# Patient Record
Sex: Female | Born: 1975 | Race: White | Hispanic: No | Smoking: Never smoker
Health system: Southern US, Community
[De-identification: ages and names within clinical notes are randomized; demographics above are authoritative.]

## PROBLEM LIST (undated history)

## (undated) DIAGNOSIS — E039 Hypothyroidism, unspecified: Secondary | ICD-10-CM

## (undated) DIAGNOSIS — Z8639 Personal history of other endocrine, nutritional and metabolic disease: Secondary | ICD-10-CM

## (undated) DIAGNOSIS — E7800 Pure hypercholesterolemia, unspecified: Secondary | ICD-10-CM

## (undated) DIAGNOSIS — E229 Hyperfunction of pituitary gland, unspecified: Secondary | ICD-10-CM

## (undated) DIAGNOSIS — F5101 Primary insomnia: Secondary | ICD-10-CM

## (undated) DIAGNOSIS — K921 Melena: Secondary | ICD-10-CM

## (undated) DIAGNOSIS — Z8249 Family history of ischemic heart disease and other diseases of the circulatory system: Secondary | ICD-10-CM

## (undated) DIAGNOSIS — Z820 Family history of epilepsy and other diseases of the nervous system: Secondary | ICD-10-CM

## (undated) DIAGNOSIS — G8929 Other chronic pain: Secondary | ICD-10-CM

## (undated) DIAGNOSIS — M549 Dorsalgia, unspecified: Secondary | ICD-10-CM

## (undated) HISTORY — DX: Hypothyroidism, unspecified: E03.9

## (undated) HISTORY — DX: Hyperfunction of pituitary gland, unspecified: E22.9

## (undated) HISTORY — DX: Pure hypercholesterolemia, unspecified: E78.00

## (undated) HISTORY — DX: Melena: K92.1

## (undated) HISTORY — DX: Personal history of other endocrine, nutritional and metabolic disease: Z86.39

## (undated) HISTORY — DX: Dorsalgia, unspecified: M54.9

## (undated) HISTORY — DX: Primary insomnia: F51.01

## (undated) HISTORY — DX: Family history of epilepsy and other diseases of the nervous system: Z82.0

## (undated) HISTORY — DX: Other chronic pain: G89.29

## (undated) HISTORY — DX: Family history of ischemic heart disease and other diseases of the circulatory system: Z82.49

---
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
## 2000-08-18 ENCOUNTER — Encounter: Admission: RE | Admit: 2000-08-18 | Discharge: 2000-08-18 | Payer: Self-pay | Admitting: Family Medicine

## 2000-08-18 ENCOUNTER — Encounter: Payer: Self-pay | Admitting: Family Medicine

## 2003-08-01 ENCOUNTER — Other Ambulatory Visit: Admission: RE | Admit: 2003-08-01 | Discharge: 2003-08-01 | Payer: Self-pay | Admitting: Obstetrics and Gynecology

## 2007-10-16 ENCOUNTER — Encounter: Admission: RE | Admit: 2007-10-16 | Discharge: 2007-10-16 | Payer: Self-pay | Admitting: Obstetrics and Gynecology

## 2010-04-09 ENCOUNTER — Encounter: Admission: RE | Admit: 2010-04-09 | Discharge: 2010-04-09 | Payer: Self-pay | Admitting: Obstetrics and Gynecology

## 2010-12-19 ENCOUNTER — Encounter: Payer: Self-pay | Admitting: Obstetrics and Gynecology

## 2012-06-13 ENCOUNTER — Other Ambulatory Visit: Payer: Self-pay | Admitting: Obstetrics and Gynecology

## 2012-06-13 DIAGNOSIS — Z1231 Encounter for screening mammogram for malignant neoplasm of breast: Secondary | ICD-10-CM

## 2012-07-06 ENCOUNTER — Ambulatory Visit: Payer: Self-pay

## 2016-05-12 ENCOUNTER — Other Ambulatory Visit (HOSPITAL_COMMUNITY): Payer: Self-pay | Admitting: Orthopedic Surgery

## 2016-05-12 ENCOUNTER — Ambulatory Visit (HOSPITAL_COMMUNITY)
Admission: RE | Admit: 2016-05-12 | Discharge: 2016-05-12 | Disposition: A | Discharge status: Home / Self Care | Payer: Managed Care, Other (non HMO) | Source: Ambulatory Visit | Attending: Internal Medicine | Admitting: Internal Medicine

## 2016-05-12 DIAGNOSIS — M79604 Pain in right leg: Secondary | ICD-10-CM | POA: Insufficient documentation

## 2016-11-28 HISTORY — PX: WRIST SURGERY: SHX841

## 2017-04-12 ENCOUNTER — Other Ambulatory Visit: Payer: Self-pay | Admitting: Nurse Practitioner

## 2017-04-12 DIAGNOSIS — N6320 Unspecified lump in the left breast, unspecified quadrant: Secondary | ICD-10-CM

## 2017-04-14 ENCOUNTER — Ambulatory Visit
Admission: RE | Admit: 2017-04-14 | Discharge: 2017-04-14 | Disposition: A | Discharge status: Home / Self Care | Payer: Managed Care, Other (non HMO) | Source: Ambulatory Visit | Attending: Nurse Practitioner | Admitting: Nurse Practitioner

## 2017-04-14 DIAGNOSIS — N6320 Unspecified lump in the left breast, unspecified quadrant: Secondary | ICD-10-CM

## 2017-08-14 ENCOUNTER — Other Ambulatory Visit: Payer: Self-pay | Admitting: Obstetrics and Gynecology

## 2017-08-14 DIAGNOSIS — R928 Other abnormal and inconclusive findings on diagnostic imaging of breast: Secondary | ICD-10-CM

## 2017-08-18 ENCOUNTER — Ambulatory Visit
Admission: RE | Admit: 2017-08-18 | Discharge: 2017-08-18 | Disposition: A | Discharge status: Home / Self Care | Payer: Managed Care, Other (non HMO) | Source: Ambulatory Visit | Attending: Obstetrics and Gynecology | Admitting: Obstetrics and Gynecology

## 2017-08-18 DIAGNOSIS — R928 Other abnormal and inconclusive findings on diagnostic imaging of breast: Secondary | ICD-10-CM

## 2017-09-28 LAB — TSH: TSH: 1.06 (ref 0.41–5.90)

## 2017-10-05 LAB — HEPATIC FUNCTION PANEL
ALT: 19 (ref 7–35)
AST: 16 (ref 13–35)
Alkaline Phosphatase: 63 (ref 25–125)

## 2017-10-05 LAB — LIPID PANEL
Cholesterol: 240 — AB (ref 0–200)
HDL: 61 (ref 35–70)
LDL Cholesterol: 154
Triglycerides: 127 (ref 40–160)

## 2017-10-05 LAB — BASIC METABOLIC PANEL
Glucose: 84
Potassium: 4.3 (ref 3.4–5.3)
Sodium: 138 (ref 137–147)

## 2017-10-05 LAB — HEMOGLOBIN A1C: Hemoglobin A1C: 5.2

## 2018-01-23 DIAGNOSIS — M25561 Pain in right knee: Secondary | ICD-10-CM | POA: Insufficient documentation

## 2018-02-06 ENCOUNTER — Other Ambulatory Visit: Payer: Self-pay | Admitting: Radiology

## 2018-02-06 DIAGNOSIS — N6310 Unspecified lump in the right breast, unspecified quadrant: Secondary | ICD-10-CM

## 2018-02-07 ENCOUNTER — Ambulatory Visit
Admission: RE | Admit: 2018-02-07 | Discharge: 2018-02-07 | Disposition: A | Discharge status: Home / Self Care | Payer: Managed Care, Other (non HMO) | Source: Ambulatory Visit | Attending: Radiology | Admitting: Radiology

## 2018-02-07 DIAGNOSIS — N6310 Unspecified lump in the right breast, unspecified quadrant: Secondary | ICD-10-CM

## 2018-07-31 DIAGNOSIS — M549 Dorsalgia, unspecified: Secondary | ICD-10-CM

## 2018-07-31 DIAGNOSIS — G8929 Other chronic pain: Secondary | ICD-10-CM | POA: Insufficient documentation

## 2018-07-31 HISTORY — DX: Other chronic pain: G89.29

## 2018-08-17 LAB — LIPID PANEL
Cholesterol: 258 — AB (ref 0–200)
HDL: 66 (ref 35–70)
LDL Cholesterol: 168
Triglycerides: 117 (ref 40–160)

## 2018-08-17 LAB — TSH: TSH: 1.17 (ref 0.41–5.90)

## 2019-02-05 ENCOUNTER — Encounter: Payer: Self-pay | Admitting: Physician Assistant

## 2019-02-05 ENCOUNTER — Other Ambulatory Visit: Payer: Self-pay | Admitting: Physician Assistant

## 2019-02-15 ENCOUNTER — Ambulatory Visit: Payer: Managed Care, Other (non HMO) | Admitting: Family Medicine

## 2019-02-26 ENCOUNTER — Encounter: Payer: Self-pay | Admitting: Family Medicine

## 2019-02-26 ENCOUNTER — Other Ambulatory Visit: Payer: Self-pay

## 2019-02-26 ENCOUNTER — Ambulatory Visit (INDEPENDENT_AMBULATORY_CARE_PROVIDER_SITE_OTHER): Payer: BLUE CROSS/BLUE SHIELD | Admitting: Family Medicine

## 2019-02-26 VITALS — BP 120/82 | HR 68 | Temp 98.7°F | Ht 61.75 in | Wt 149.6 lb

## 2019-02-26 DIAGNOSIS — E039 Hypothyroidism, unspecified: Secondary | ICD-10-CM | POA: Diagnosis not present

## 2019-02-26 DIAGNOSIS — R7989 Other specified abnormal findings of blood chemistry: Secondary | ICD-10-CM

## 2019-02-26 DIAGNOSIS — Z820 Family history of epilepsy and other diseases of the nervous system: Secondary | ICD-10-CM

## 2019-02-26 DIAGNOSIS — F5101 Primary insomnia: Secondary | ICD-10-CM | POA: Diagnosis not present

## 2019-02-26 DIAGNOSIS — E229 Hyperfunction of pituitary gland, unspecified: Secondary | ICD-10-CM

## 2019-02-26 DIAGNOSIS — E7800 Pure hypercholesterolemia, unspecified: Secondary | ICD-10-CM

## 2019-02-26 DIAGNOSIS — Z1331 Encounter for screening for depression: Secondary | ICD-10-CM

## 2019-02-26 DIAGNOSIS — Z8249 Family history of ischemic heart disease and other diseases of the circulatory system: Secondary | ICD-10-CM

## 2019-02-26 DIAGNOSIS — Z86018 Personal history of other benign neoplasm: Secondary | ICD-10-CM

## 2019-02-26 DIAGNOSIS — Z8639 Personal history of other endocrine, nutritional and metabolic disease: Secondary | ICD-10-CM

## 2019-02-26 MED ORDER — TRAZODONE HCL 50 MG PO TABS: ORAL_TABLET | ORAL | 1 refills | Status: DC

## 2019-02-26 NOTE — Progress Notes (Signed)
Alexis Walker is a 43 y.o. female is here to [employer].   Patient Care Team: Briscoe Deutscher, DO as PCP - General (Family Medicine)   History of Present Illness:   HPI:   Current symptoms: none. Patient denies change in energy level, diarrhea, heat / cold intolerance, nervousness, palpitations and weight changes. Symptoms have been well-controlled.  Lab Results  Component Value Date   TSH 1.17 08/17/2018   TSH 1.06 10/05/2017   No results found for: FREET4   Trying to exercise on a regular basis? No. Compliant with diet? Yes.  Lab Results  Component Value Date   CHOL 258 (A) 08/17/2018   HDL 66 08/17/2018   LDLCALC 168 08/17/2018   TRIG 117 08/17/2018   Lab Results  Component Value Date   ALT 19 10/05/2017   AST 16 10/05/2017   ALKPHOS 63 10/05/2017     The 10-year ASCVD risk score Mikey Bussing DC Jr., et al., 2013) is: 0.7%*   Values used to calculate the score:     Age: 43 years     Sex: Female     Is Non-Hispanic African American: No     Diabetic: No     Tobacco smoker: No     Systolic Blood Pressure: 191 mmHg     Is BP treated: No     HDL Cholesterol: 66 mg/dL*     Total Cholesterol: 258 mg/dL*     * - Cholesterol units were assumed for this score calculation  Health Maintenance Due  Topic Date Due  . HIV Screening  07/03/1990   Depression screen PHQ 2/9 02/26/2019  Decreased Interest 0  Down, Depressed, Hopeless 0  PHQ - 2 Score 0  Altered sleeping 3  Tired, decreased energy 3  Change in appetite 0  Feeling bad or failure about yourself  0  Trouble concentrating 0  Moving slowly or fidgety/restless 0  Suicidal thoughts 0  PHQ-9 Score 6  Difficult doing work/chores Not difficult at all   PMHx, SurgHx, SocialHx, Medications, and Allergies were reviewed in the Visit Navigator and updated as appropriate.   Past Medical History:  Diagnosis Date  . Acquired hypothyroidism 02/27/2019  . Chronic back pain 07/31/2018  . Elevated prolactin level ([place]) 02/27/2019   . Family history of heart disease 02/27/2019  . Family hx of ALS (amyotrophic lateral sclerosis), father 02/27/2019  . History of pituitary adenoma 02/27/2019  . Primary insomnia 02/27/2019  . Pure hypercholesterolemia 02/27/2019     Past Surgical History:  Procedure Laterality Date  . APPENDECTOMY  1990  . WRIST SURGERY  2018     Family History  Problem Relation Age of Onset  . Breast cancer Paternal Aunt    Social History   Tobacco Use  . Smoking status: Never Smoker  . Smokeless tobacco: Never Used  Substance Use Topics  . Alcohol use: Yes  . Drug use: Not Currently   Current Medications and Allergies   .  Norethin Ace-Eth Estrad-FE (TAYTULLA) 1-20 MG-MCG(24) CAPS, Take by mouth., Disp: , Rfl:  .  SYNTHROID 150 MCG tablet, Take 150 mcg by mouth daily., Disp: , Rfl:  .  zolpidem (AMBIEN) 5 MG tablet, TAKE 1 TABLET BY MOUTH ONCE DAILY AT BEDTIME AS NEEDED FOR SLEEP, Disp: , Rfl:   No Known Allergies   Review of Systems   Pertinent items are noted in the HPI. Otherwise, a complete ROS is negative.  Vitals   Vitals:   02/26/19 1440  BP: 120/82  Pulse: 68  Temp: 98.7 F (37.1 C)  TempSrc: Oral  SpO2: 99%  Weight: 149 lb 9.6 oz (67.9 kg)  Height: 5' 1.75" (1.568 m)     Body mass index is 27.58 kg/m.  Physical Exam   Physical Exam Vitals signs and nursing note reviewed.  Constitutional:      General: She is not in acute distress.    Appearance: She is well-developed.  HENT:     Head: Normocephalic and atraumatic.     Right Ear: External ear normal.     Left Ear: External ear normal.     Nose: Nose normal.  Eyes:     Conjunctiva/sclera: Conjunctivae normal.     Pupils: Pupils are equal, round, and reactive to light.  Neck:     Musculoskeletal: Normal range of motion and neck supple.     Thyroid: No thyromegaly.  Cardiovascular:     Rate and Rhythm: Normal rate and regular rhythm.     Heart sounds: Normal heart sounds.  Pulmonary:     Effort: Pulmonary  effort is normal.     Breath sounds: Normal breath sounds.  Abdominal:     General: Bowel sounds are normal.     Palpations: Abdomen is soft.  Musculoskeletal: Normal range of motion.  Lymphadenopathy:     Cervical: No cervical adenopathy.  Skin:    General: Skin is warm and dry.     Capillary Refill: Capillary refill takes less than 2 seconds.  Neurological:     Mental Status: She is alert and oriented to person, place, and time.  Psychiatric:        Behavior: Behavior normal.     Results for orders placed or performed in visit on 02/05/19  Lipid panel  Result Value Ref Range   Triglycerides 117 40 - 160   Cholesterol 258 (A) 0 - 200   HDL 66 35 - 70   LDL Cholesterol 168   TSH  Result Value Ref Range   TSH 1.17 0.41 - 5.90    Assessment and Plan   Alexis Walker was seen today for establish care, insomnia and hyperlipidemia.  Diagnoses and all orders for this visit:  Pure hypercholesterolemia -     CT CARDIAC SCORING; Future  Acquired hypothyroidism  Primary insomnia -     traZODone (DESYREL) 50 MG tablet; 1-2 tablets at night  Elevated prolactin level (HCC)  History of pituitary adenoma  Family hx of ALS (amyotrophic lateral sclerosis), father  Family history of heart disease -     CT CARDIAC SCORING; Future    . Orders and follow up as documented in [place], reviewed diet, exercise and weight control, cardiovascular risk and specific lipid/LDL goals reviewed, reviewed medications and side effects in detail.  . Reviewed expectations re: course of current medical issues. . Outlined signs and symptoms indicating need for more acute intervention. . Patient verbalized understanding and all questions were answered. . Patient received an After Visit Summary.  Briscoe Deutscher, DO [place], [facility] 03/03/2019

## 2019-02-27 ENCOUNTER — Encounter: Payer: Self-pay | Admitting: Family Medicine

## 2019-02-27 DIAGNOSIS — Z820 Family history of epilepsy and other diseases of the nervous system: Secondary | ICD-10-CM

## 2019-02-27 DIAGNOSIS — R7989 Other specified abnormal findings of blood chemistry: Secondary | ICD-10-CM

## 2019-02-27 DIAGNOSIS — E039 Hypothyroidism, unspecified: Secondary | ICD-10-CM

## 2019-02-27 DIAGNOSIS — E229 Hyperfunction of pituitary gland, unspecified: Secondary | ICD-10-CM

## 2019-02-27 DIAGNOSIS — Z8639 Personal history of other endocrine, nutritional and metabolic disease: Secondary | ICD-10-CM

## 2019-02-27 DIAGNOSIS — F5101 Primary insomnia: Secondary | ICD-10-CM

## 2019-02-27 DIAGNOSIS — E7800 Pure hypercholesterolemia, unspecified: Secondary | ICD-10-CM | POA: Insufficient documentation

## 2019-02-27 DIAGNOSIS — Z8249 Family history of ischemic heart disease and other diseases of the circulatory system: Secondary | ICD-10-CM

## 2019-02-27 DIAGNOSIS — Z86018 Personal history of other benign neoplasm: Secondary | ICD-10-CM | POA: Insufficient documentation

## 2019-02-27 HISTORY — DX: Hypothyroidism, unspecified: E03.9

## 2019-02-27 HISTORY — DX: Other specified abnormal findings of blood chemistry: R79.89

## 2019-02-27 HISTORY — DX: Primary insomnia: F51.01

## 2019-02-27 HISTORY — DX: Personal history of other benign neoplasm: Z86.018

## 2019-02-27 HISTORY — DX: Family history of epilepsy and other diseases of the nervous system: Z82.0

## 2019-02-27 HISTORY — DX: Pure hypercholesterolemia, unspecified: E78.00

## 2019-02-27 HISTORY — DX: Family history of ischemic heart disease and other diseases of the circulatory system: Z82.49

## 2019-03-11 DIAGNOSIS — J029 Acute pharyngitis, unspecified: Secondary | ICD-10-CM | POA: Diagnosis not present

## 2019-04-03 ENCOUNTER — Telehealth: Payer: Self-pay | Admitting: Family Medicine

## 2019-04-03 NOTE — Telephone Encounter (Signed)
Let's do a virtual appointment on Friday.

## 2019-04-03 NOTE — Telephone Encounter (Signed)
Copied from [place] [id number]. Topic: General - Other >> Apr 03, 2019  8:08 AM Leward Quan A wrote: Reason for CRM: Patient called to say that the traZODone (DESYREL) 50 MG tablet is not working and she have not slept and due to this she is frustrated. She say that she is not feeling well within her body due to the lack of sleep asking for a call back please. Ph# [phone]

## 2019-04-03 NOTE — Telephone Encounter (Signed)
Has tried Trazodone with inadequate effect. She has tried Ambien in the past but does not want to be on something that is habit forming.   Forwarding to Dr. Juleen China to advise.

## 2019-04-05 ENCOUNTER — Ambulatory Visit (INDEPENDENT_AMBULATORY_CARE_PROVIDER_SITE_OTHER): Payer: BLUE CROSS/BLUE SHIELD | Admitting: Family Medicine

## 2019-04-05 ENCOUNTER — Other Ambulatory Visit: Payer: Self-pay

## 2019-04-05 ENCOUNTER — Encounter: Payer: Self-pay | Admitting: Family Medicine

## 2019-04-05 ENCOUNTER — Other Ambulatory Visit: Payer: Self-pay | Admitting: Family Medicine

## 2019-04-05 VITALS — Ht 61.75 in | Wt 149.0 lb

## 2019-04-05 DIAGNOSIS — F5101 Primary insomnia: Secondary | ICD-10-CM

## 2019-04-05 MED ORDER — SUVOREXANT 10 MG PO TABS: 10.0000 mg | ORAL_TABLET | Freq: Every day | ORAL | 0 refills | Status: DC

## 2019-04-05 NOTE — Telephone Encounter (Signed)
Copied from [place] [id number]. Topic: Quick Communication - Rx Refill/Question >> Apr 05, 2019  3:37 PM [place], [place] D wrote: Medication:Suvorexant (BELSOMRA) 10 MG TABS / Rx cost is around $300. Requesting an alternative that would be cheaper. Please advise.  Has the patient contacted their pharmacy? Yes.   (Agent: If no, request that the patient contact the pharmacy for the refill.) (Agent: If yes, when and what did the pharmacy advise?)  Preferred Pharmacy (with phone number or street name): [facility], [phone] (Phone) [phone] (Fax)    Agent: Please be advised that RX refills may take up to 3 business days. We ask that you follow-up with your pharmacy.

## 2019-04-05 NOTE — Progress Notes (Signed)
Virtual Visit via Video   Due to the COVID-19 pandemic, this visit was completed with telemedicine (audio/video) technology to reduce patient and provider exposure as well as to preserve personal protective equipment.   I connected with Alexis Walker by a video enabled telemedicine application and verified that I am speaking with the correct person using two identifiers. Location patient: Home Location provider: [facility], Office Persons participating in the virtual visit: [place], Briscoe Deutscher, DO Lonell Grandchild, CMA acting as scribe for Dr. Briscoe Deutscher.   I discussed the limitations of evaluation and management by telemedicine and the availability of in person appointments. The patient expressed understanding and agreed to proceed.  Care Team   Patient Care Team: Briscoe Deutscher, DO as PCP - General (Family Medicine)  Subjective:   HPI:   Symptoms include: falls asleep easily, has interrupted sleep, has restless sleep, has snoring and has daytime sleepiness. The patient has been taking: Trazadon 50mg  two tab night . she is only sleeping about 3-4 hours a night. Side effects from the medication: not working at all. When she takes the trazodone she does not have any side effects the next day.    She has had issues with snoring to the point where she is waking herself up at night. She has never had a sleep study but would like one. She has been on Ambien in the past. At the 10mg  she is able to sleep.  She is able to sleep 5 hrs a night. The only issue she has with it is the fact that if she takes she has hard time coming off of it. She does feel like it is more helpful than the trazodone. She is discouraged because she is exhausted every day. She is willing to try new medications.   Review of Systems  Constitutional: Negative for chills and fever.  HENT: Negative for hearing loss and tinnitus.   Eyes: Negative for blurred vision, double vision and photophobia.  Respiratory:  Negative for cough and hemoptysis.   Cardiovascular: Negative for chest pain and palpitations.  Gastrointestinal: Negative for heartburn and nausea.  Genitourinary: Negative for dysuria and urgency.  Musculoskeletal: Negative for myalgias and neck pain.  Skin: Negative for rash.  Neurological: Negative for dizziness and headaches.  Endo/Heme/Allergies: Negative for environmental allergies. Does not bruise/bleed easily.  Psychiatric/Behavioral: Negative for depression and suicidal ideas.    Patient Active Problem List   Diagnosis Date Noted  . Pure hypercholesterolemia 02/27/2019  . Acquired hypothyroidism 02/27/2019  . Primary insomnia 02/27/2019  . Elevated prolactin level ([place]) 02/27/2019  . History of pituitary adenoma 02/27/2019  . Family hx of ALS (amyotrophic lateral sclerosis), father 02/27/2019  . Family history of heart disease 02/27/2019  . Chronic back pain 07/31/2018    Social History   Tobacco Use  . Smoking status: Never Smoker  . Smokeless tobacco: Never Used  Substance Use Topics  . Alcohol use: Yes   Current Outpatient Medications:  .  Norethin Ace-Eth Estrad-FE (TAYTULLA) 1-20 MG-MCG(24) CAPS, Take by mouth., Disp: , Rfl:  .  SYNTHROID 150 MCG tablet, Take 150 mcg by mouth daily., Disp: , Rfl:  .  traZODone (DESYREL) 50 MG tablet, 1-2 tablets at night, Disp: 60 tablet, Rfl: 1  No Known Allergies  Objective:   VITALS: Per patient if applicable, see vitals. GENERAL: Alert, appears well and in no acute distress. HEENT: Atraumatic, conjunctiva clear, no obvious abnormalities on inspection of external nose and ears. NECK: Normal movements of the  head and neck. CARDIOPULMONARY: No increased WOB. Speaking in clear sentences. I:E ratio WNL.  MS: Moves all visible extremities without noticeable abnormality. PSYCH: Pleasant and cooperative, well-groomed. Speech normal rate and rhythm. Affect is appropriate. Insight and judgement are appropriate. Attention is  focused, linear, and appropriate.  NEURO: CN grossly intact. Oriented as arrived to appointment on time with no prompting. Moves both UE equally.  SKIN: No obvious lesions, wounds, erythema, or cyanosis noted on face or hands.  Depression screen [facility] 2/9 02/26/2019  Decreased Interest 0  Down, Depressed, Hopeless 0  PHQ - 2 Score 0  Altered sleeping 3  Tired, decreased energy 3  Change in appetite 0  Feeling bad or failure about yourself  0  Trouble concentrating 0  Moving slowly or fidgety/restless 0  Suicidal thoughts 0  PHQ-9 Score 6  Difficult doing work/chores Not difficult at all    Assessment and Plan:   Alexis Walker was seen today for insomnia.  Diagnoses and all orders for this visit:  Primary insomnia -     Ambulatory referral to Sleep Studies -     Suvorexant (BELSOMRA) 10 MG TABS; Take 10 mg by mouth at bedtime.   Marland Kitchen COVID-19 Education: The signs and symptoms of COVID-19 were discussed with the patient and how to seek care for testing if needed. The importance of social distancing was discussed today. . Reviewed expectations re: course of current medical issues. . Discussed self-management of symptoms. . Outlined signs and symptoms indicating need for more acute intervention. . Patient verbalized understanding and all questions were answered. Marland Kitchen Health Maintenance issues including appropriate healthy diet, exercise, and smoking avoidance were discussed with patient. . See orders for this visit as documented in the electronic medical record.  Briscoe Deutscher, DO  Records requested if needed. Time spent: 25 minutes, of which >50% was spent in obtaining information about her symptoms, reviewing her previous labs, evaluations, and treatments, counseling her about her condition (please see the discussed topics above), and developing a plan to further investigate it; she had a number of questions which I addressed.

## 2019-04-05 NOTE — Telephone Encounter (Signed)
See request °

## 2019-04-05 NOTE — Telephone Encounter (Signed)
See telephone encounter.

## 2019-04-05 NOTE — Telephone Encounter (Signed)
Forwarding to Dr. Wallace to advise.  

## 2019-04-05 NOTE — Patient Instructions (Addendum)
Belsomra  Restoril (more short term) Silenor (the medication that you originally asked about) Seroquel

## 2019-04-08 NOTE — Telephone Encounter (Signed)
See note

## 2019-04-08 NOTE — Telephone Encounter (Signed)
Totally okay to stay on the Ambien. Tee it up if she needs more.

## 2019-04-08 NOTE — Telephone Encounter (Signed)
Pt called back requesting to speak to Bee. Please advise.

## 2019-04-08 NOTE — Telephone Encounter (Signed)
I put several medication options on the AVS. I can choose the next one, but see if she has a preference first.

## 2019-04-08 NOTE — Telephone Encounter (Signed)
Spoke with patient, she does not want to try Seroquel because she read up on that and doesn't think that's a good option for her. She thinks the Silenor will probably be just as expensive as the Belsomra. She was interested in trying Restoril but didn't like that it would just be for more short term use. She wonders if Dr Juleen China thinks it may be best for her to stay on the Ambien because she knows that has worked for her.

## 2019-04-08 NOTE — Telephone Encounter (Signed)
Per AVS -  Belsomra  Restoril (more short term) Silenor (the medication that you originally asked about) Seroquel  Called pt and left VM to call the office.

## 2019-04-09 ENCOUNTER — Telehealth: Payer: Self-pay | Admitting: Neurology

## 2019-04-09 MED ORDER — ZOLPIDEM TARTRATE 5 MG PO TABS: ORAL_TABLET | ORAL | 1 refills | Status: AC

## 2019-04-09 NOTE — Telephone Encounter (Signed)
Due to current COVID 19 pandemic, our office is severely reducing in office visits, in order to minimize the risk to our patients and healthcare providers.   Pt understands that although there may be some limitations with this type of visit, we will take all precautions to reduce any security or privacy concerns.  Pt understands that this will be treated like an in office visit and we will file with pt's insurance, and there may be a patient responsible charge related to this service.  Pt's email is [email or web address]. Pt will be using [email or web address] for their virtual visit. Pt understands that the nurse will be calling to go over pt's chart.

## 2019-04-09 NOTE — Telephone Encounter (Signed)
Called pt and advised. Rx request sent to Dr. Juleen China to approve. She will call back if she changes her mind and would like to try and alternative medication.

## 2019-04-11 NOTE — Addendum Note (Signed)
Addended by: L[place]  A on: 04/11/2019 10:57 AM   Modules accepted: Orders

## 2019-04-11 NOTE — Telephone Encounter (Signed)
I called pt to update her chart. No answer, left a message asking her to call me back. 

## 2019-04-11 NOTE — Telephone Encounter (Signed)
Pt returned my call. Pt's meds, allergies, and PMH were updated.  Pt reports that she has never had a sleep study but does endorse snoring.  Pt's weight is 150 lbs ad she is 5'1.75''.  She reports that her neck size is between 14.25-14.5''.  Epworth Sleepiness Scale 0= would never doze 1= slight chance of dozing 2= moderate chance of dozing 3= high chance of dozing  Sitting and reading: 1 Watching TV: 1 Sitting inactive in a public place (ex. Theater or meeting): 2 As a passenger in a car for an hour without a break: 1 Lying down to rest in the afternoon: 1 Sitting and talking to someone: 0 Sitting quietly after lunch (no alcohol): 1 In a car, while stopped in traffic: 0 Total: 7  FSS: 36

## 2019-04-15 ENCOUNTER — Other Ambulatory Visit: Payer: Self-pay

## 2019-04-15 ENCOUNTER — Encounter: Payer: Self-pay | Admitting: Neurology

## 2019-04-15 ENCOUNTER — Ambulatory Visit (INDEPENDENT_AMBULATORY_CARE_PROVIDER_SITE_OTHER): Payer: BLUE CROSS/BLUE SHIELD | Admitting: Neurology

## 2019-04-15 DIAGNOSIS — R351 Nocturia: Secondary | ICD-10-CM | POA: Diagnosis not present

## 2019-04-15 DIAGNOSIS — R0683 Snoring: Secondary | ICD-10-CM | POA: Diagnosis not present

## 2019-04-15 DIAGNOSIS — G4700 Insomnia, unspecified: Secondary | ICD-10-CM

## 2019-04-15 DIAGNOSIS — E663 Overweight: Secondary | ICD-10-CM

## 2019-04-15 DIAGNOSIS — G479 Sleep disorder, unspecified: Secondary | ICD-10-CM | POA: Diagnosis not present

## 2019-04-15 NOTE — Progress Notes (Signed)
Star Age, MD, PhD [facility] [address], [address] [name], [place] [address]   Virtual Visit via Video Note on 04/15/2019: I connected with [name] on 04/15/19 at  1:30 PM EDT by a video enabled telemedicine application and verified that I am speaking with the correct person using two identifiers.   I discussed the limitations of evaluation and management by telemedicine and the availability of in person appointments. The patient expressed understanding and agreed to proceed.  History of Present Illness: Ms. Alexis Walker is a 43 year old right-handed woman with an underlying medical history of hyperlipidemia, history of pituitary adenoma, chronic back pain, hypothyroidism, and mildly overweight state, who presents for virtual visit via doxy.me for a new patient evaluation of her sleep disturbance, in particular, snoring and difficulty maintaining sleep.  She is referred by her primary care physician, Dr. Briscoe Deutscher and I reviewed her virtual visit note from 04/05/2019.  Patient reports snoring which has been disturbing to herself, she wakes up sometimes from her own snoring.  She has had difficulty maintaining sleep and sleep disruption, daytime tiredness and exhaustion for years.  She had tried Ambien in the past which worked fairly well.  She has been on trazodone and was recently started on Belsomra.  Her Epworth sleepiness score is 7 out of 24, fatigue score is 36 out of 63.  She has had difficulty maintaining sleep for over 20 years.  Over the course of time she has tried over-the-counter medication including melatonin and p.m. type medications.  She usually uses a Paediatric nurse generic over-the-counter sleep aid currently.  She has a prescription for Ambien 5 mg strength, tries not to take it every night, estimates that she takes it typically 2 nights out of the week currently.  It has been working well for her but she does not like to take it.  She tried  trazodone which worked in the beginning, she tried it for about a month.  In the past she tried Industrial/product designer.  None of these medications worked for a long period of time.  She was not able to afford the Belsomra.She has no telltale family history of sleep difficulties, father died young in his 6s from [place].  Her mother is about 52 and generally healthy.  She has no obvious family history of obstructive sleep apnea.  She lives with her boyfriend and reports some snoring, does not have any telltale symptoms of restless legs.She tries to be in bed around 930, likes to read on her Kindle for about 40 minutes to 60 minutes and then turns off the light, she does not watch TV in her bedroom and uses earplugs and an eye mask.She has no pets, no kids.  She does wake up to go to the bathroom typically 2 or 3 times on an average night, rise time for work is around 6.  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     Her Past Medical History Is Significant For: Past Medical History:  Diagnosis Date   Acquired hypothyroidism 02/27/2019   Blood in stool    Chronic back pain 07/31/2018   Elevated prolactin level ([place]) 02/27/2019   Family history of heart disease 02/27/2019   Family hx of ALS (amyotrophic lateral sclerosis), father 02/27/2019   History of pituitary adenoma 02/27/2019   Primary insomnia 02/27/2019   Pure hypercholesterolemia 02/27/2019    Her Past Surgical History Is Significant For: Past Surgical History:  Procedure Laterality  Date   APPENDECTOMY  1990   WRIST SURGERY  2018    Her Family History Is Significant For: Family History  Problem Relation Age of Onset   Breast cancer Paternal 24    Alcohol abuse Mother    ALS Father    Cancer Father    Hypercalcemia Father    Alcohol abuse Sister    Drug abuse Sister    Hypercalcemia Maternal Grandmother    Heart disease Maternal Grandmother    Alcohol abuse Maternal Grandfather    Heart attack Maternal  Grandfather    Heart disease Maternal Grandfather    Hyperlipidemia Maternal Grandfather    Arthritis Paternal Grandmother    Heart attack Paternal Grandfather    Heart disease Paternal Grandfather    Hypertension Paternal Grandfather    Hyperlipidemia Paternal Grandfather     Her Social History Is Significant For: Social History   Socioeconomic History   Marital status: Divorced    Spouse name: Not on file   Number of children: Not on file   Years of education: Not on file   Highest education level: Not on file  Occupational History   Occupation: Sport and exercise psychologist: [employer]    Comment: [employer]  Social Needs   Financial resource strain: Not on file   Food insecurity:    Worry: Not on file    Inability: Not on file   Transportation needs:    Medical: Not on file    Non-medical: Not on file  Tobacco Use   Smoking status: Never Smoker   Smokeless tobacco: Never Used  Substance and Sexual Activity   Alcohol use: Yes   Drug use: Not Currently   Sexual activity: Not on file  Lifestyle   Physical activity:    Days per week: Not on file    Minutes per session: Not on file   Stress: Not on file  Relationships   Social connections:    Talks on phone: Not on file    Gets together: Not on file    Attends religious service: Not on file    Active member of club or organization: Not on file    Attends meetings of clubs or organizations: Not on file    Relationship status: Not on file  Other Topics Concern   Not on file  Social History Narrative   Not on file    Her Allergies Are:  No Known Allergies:   Her Current Medications Are:  Outpatient Encounter Medications as of 04/15/2019  Medication Sig   Norethin Ace-Eth Estrad-FE (TAYTULLA) 1-20 MG-MCG(24) CAPS Take by mouth.   SYNTHROID 150 MCG tablet Take 150 mcg by mouth daily.   zolpidem (AMBIEN) 5 MG tablet TAKE 1 TABLET BY MOUTH ONCE DAILY AT BEDTIME AS NEEDED FOR SLEEP     No facility-administered encounter medications on file as of 04/15/2019.   :   Review of Systems:  Out of a complete 14 point review of systems, all are reviewed and negative with the exception of these symptoms as listed below:  Observations/Objective:   The most recent vital signs available for my review in her chart are from 02/26/2019 as documented: Blood pressure 120/82, pulse 68, temperature 98.7, weight 149.6 pounds for a BMI of 27.58. Her neck circumference by self-report is 14.25 or 14.5 inches. On examination, she is pleasant, conversant, in no acute distress.  She has good comprehension and language skills.  Speech is clear without dysarthria, hypophonia or voice tremor  noted, face is symmetric with normal facial animation and extraocular movements are well preserved without obvious nystagmus or gaze limitation noted.  Hearing is grossly intact.  Shoulder height equal.  Airway examination reveals a Mallampati class I, slightly prominent appearing uvula but otherwise no large tonsils, tongue protrudes centrally in palate elevates symmetrically.  Upper body muscle bulk normal, Romberg is negative, motor exam otherwise reveals normal-appearing coordination with Finger taps.  She has no drift, no postural or action tremor.  Cerebellar testing with finger-to-nose testing shows no dysmetria or intention tremor.  She is able to walk tandem without wavering.  Assessment and Plan:  Ms. Eloina Ergle is a 43 year old right-handed woman with an underlying medical history of hyperlipidemia, history of pituitary adenoma, chronic back pain, hypothyroidism, and mildly overweight state, Who presents for a video based visit via doxy.me for evaluation of her sleep disturbance, in particular her difficulty maintaining sleep for many years duration.  While not telltale, an underlying sleep disordered breathing has not been excluded, as she has never had any sleep testing before.  She has tried multiple  nonprescription and prescription sleep aids.  She is advised to consider cognitive behavioral therapy and is encouraged to talk with her primary care physician about this.  In the event that she does have obstructive sleep apnea or periodic leg movements of sleep which can be disturbing to sleep without obvious restless leg symptoms, we could potentially treat an underlying organic sleep disorder.  She is willing to proceed with sleep study testing.  I explained the difference between home testing and a laboratory attended sleep study.  I discussed with the patient the diagnosis of OSA, its prognosis and treatment options. I explained the use of a CPAP machine for OSA. She would be willing to Consider positive airway pressure treatment.  We talked about the importance of maintaining good sleep hygiene.  We will proceed with sleep study testing.  The sleep lab will call her for scheduling purposes. I plan to see the patient back after the sleep study is completed and encouraged her to call with any interim questions, concerns, problems or updates.   Star Age, MD, PhD   Follow Up Instructions:    I discussed the assessment and treatment plan with the patient. The patient was provided an opportunity to ask questions and all were answered. The patient agreed with the plan and demonstrated an understanding of the instructions.   The patient was advised to call back or seek an in-person evaluation if the symptoms worsen or if the condition fails to improve as anticipated.  I provided 30 minutes of non-face-to-face time during this encounter.   Star Age, MD

## 2019-05-10 DIAGNOSIS — M48061 Spinal stenosis, lumbar region without neurogenic claudication: Secondary | ICD-10-CM | POA: Diagnosis not present

## 2019-05-21 ENCOUNTER — Ambulatory Visit (INDEPENDENT_AMBULATORY_CARE_PROVIDER_SITE_OTHER): Payer: BC Managed Care – PPO | Admitting: Neurology

## 2019-05-21 DIAGNOSIS — G4700 Insomnia, unspecified: Secondary | ICD-10-CM

## 2019-05-21 DIAGNOSIS — G4733 Obstructive sleep apnea (adult) (pediatric): Secondary | ICD-10-CM

## 2019-05-21 DIAGNOSIS — G479 Sleep disorder, unspecified: Secondary | ICD-10-CM

## 2019-05-21 DIAGNOSIS — E663 Overweight: Secondary | ICD-10-CM

## 2019-05-21 DIAGNOSIS — R0683 Snoring: Secondary | ICD-10-CM

## 2019-05-21 DIAGNOSIS — R351 Nocturia: Secondary | ICD-10-CM

## 2019-05-23 ENCOUNTER — Telehealth: Payer: Self-pay

## 2019-05-23 NOTE — Addendum Note (Signed)
Addended by: Star Age on: 05/23/2019 08:27 AM   Modules accepted: Orders

## 2019-05-23 NOTE — Telephone Encounter (Signed)
I called pt. I advised pt that Dr. Rexene Alberts reviewed their sleep study results and found that pt has mild osa. Dr. Rexene Alberts recommends that pt start an auto pap at home. I discussed alternative txs as well. I reviewed PAP compliance expectations with the pt. Pt is agreeable to starting an auto-PAP. I advised pt that an order will be sent to a DME, [facility], and [facility] will call the pt within about one week after they file with the pt's insurance. [facility] will show the pt how to use the machine, fit for masks, and troubleshoot the auto-PAP if needed. A follow up appt was made for insurance purposes with Dr. Rexene Alberts on 07/24/19 at 2:00pm. Pt verbalized understanding to arrive 15 minutes early and bring their auto-PAP. A letter with all of this information in it will be mailed to the pt as a reminder. I verified with the pt that the address we have on file is correct. Pt verbalized understanding of results. Pt had no questions at this time but was encouraged to call back if questions arise. I have sent the order to [facility] and have received confirmation that they have received the order.

## 2019-05-23 NOTE — Telephone Encounter (Signed)
-----   Message from Star Age, MD sent at 05/23/2019  8:27 AM EDT ----- Patient referred by Dr. Juleen China, seen by me on 04/15/19 for VV, diagnostic PSG on 05/21/19.    Please call and notify the patient that the recent sleep study showed rather mild obstructive sleep apnea. OSA is overall quite mild, but worth treating to see if she feels better after treatment.  She actually slept quite well, over 85% of the time tested and achieved all stages of sleep. Nevertheless, I recommend a trial of autoPAP, which means, that we don't have to bring her back for a second sleep study with CPAP, but will let him try an autoPAP machine at home, through a DME company (of her choice, or as per insurance requirement). The DME representative will educate her on how to use the machine, how to put the mask on, etc. I have placed an order in the chart. Please send referral, talk to patient, send report to referring MD. We will need a FU in sleep clinic for 10 weeks post-PAP set up, please arrange that with me or one of our NPs.  Alternatively, she can consider an oral appliance with the help of a dentist. She may improve her snoring with a little bit of wt loss, but she is clearly not obese.   Star Age, MD, PhD [facility])

## 2019-05-23 NOTE — Telephone Encounter (Signed)
I called pt to discuss her sleep study results. No answer, left a message asking her to call me back. 

## 2019-05-23 NOTE — Procedures (Signed)
PATIENT'S NAME:  Alex[place], Walker DOB:      05-29-1976      MR#:    [id number]     DATE OF RECORDING: 05/21/2019 REFERRING M.D.:  Briscoe Deutscher, DO Study Performed:   Baseline Polysomnogram HISTORY: 43 year old woman with a history of hyperlipidemia, history of pituitary adenoma, chronic back pain, hypothyroidism, and mildly overweight state, who reports snoring and difficulty maintaining sleep. The patient endorsed the Epworth Sleepiness Scale at 7/24 points. The patient's weight 149 pounds with a height of 62 (inches), resulting in a BMI of 27.6 kg/m2. The patient's neck circumference measured 14.5 inches.  CURRENT MEDICATIONS: Taytulla, Synthroid, Ambien   PROCEDURE:  This is a multichannel digital polysomnogram utilizing the Somnostar 11.2 system.  Electrodes and sensors were applied and monitored per AASM Specifications.   EEG, EOG, Chin and Limb EMG, were sampled at 200 Hz.  ECG, Snore and Nasal Pressure, Thermal Airflow, Respiratory Effort, CPAP Flow and Pressure, Oximetry was sampled at 50 Hz. Digital video and audio were recorded.      BASELINE STUDY  Lights Out was at 21:55 and Lights On at 05:00.  Total recording time (TRT) was 425.5 minutes, with a total sleep time (TST) of 372.5 minutes.   The patient's sleep latency was 16.5 minutes, which is normal. REM latency was 76.5 minutes, which is normal.  The sleep efficiency was 87.5 %.     SLEEP ARCHITECTURE: WASO (Wake after sleep onset) was 36 minutes with one longer period of wakefulness noted and otherwise no significant sleep fragmentation. There were 2 minutes in Stage N1, 246.5 minutes Stage N2, 50.5 minutes Stage N3 and 73.5 minutes in Stage REM.  The percentage of Stage N1 was .5%, Stage N2 was 66.2%, which is mildly increased, Stage N3 was 13.6%, which is mildly reduced, and Stage R (REM sleep) was 19.7%, which is normal/near-normal. The arousals were noted as: 75 were spontaneous, 0 were associated with PLMs, 17 were associated with  respiratory events.  RESPIRATORY ANALYSIS:  There were a total of 47 respiratory events:  3 obstructive apneas, 0 central apneas and 0 mixed apneas with a total of 3 apneas and an apnea index (AI) of .5 /hour. There were 44 hypopneas with a hypopnea index of 7.1 /hour. The patient also had 0 respiratory event related arousals (RERAs).      The total APNEA/HYPOPNEA INDEX (AHI) was 7.6 /hour and the total RESPIRATORY DISTURBANCE INDEX was 7.6 /hour.  1 events occurred in REM sleep and 88 events in NREM. The REM AHI was .8 /hour, versus a non-REM AHI of 9.2. The patient spent 97.5 minutes of total sleep time in the supine position and 275 minutes in non-supine.. The supine AHI was 0.6 versus a non-supine AHI of 10.1.  OXYGEN SATURATION & C02:  The Wake baseline 02 saturation was 95%, with the lowest being 90% (84% on the technical report appeared to be artifactual, during wakefulness). Time spent below 89% saturation equaled 0 minutes.   PERIODIC LIMB MOVEMENTS: The patient had a total of 0 Periodic Limb Movements.  The Periodic Limb Movement (PLM) index was 0 and the PLM Arousal index was 0/hour.  Audio and video analysis did not show any abnormal or unusual movements, behaviors, phonations or vocalizations. The patient took 1 bathroom break. Mild to moderate snoring was noted. The EKG was in keeping with normal sinus rhythm (NSR).  Post-study, the patient indicated that sleep was the same as usual.   IMPRESSION:  1. Obstructive Sleep Apnea (OSA),  Mild  RECOMMENDATIONS:  1. This study demonstrates overall mild obstructive sleep apnea with a total AHI of 7.6/hour, and O2 nadir of 90%. Given the patient's medical history and sleep related complaints, treatment with positive airway pressure is a reasonable choice; this can be achieved in the form of autoPAP. Alternatively, a full-night CPAP titration study would allow optimization of therapy if needed. Other treatment options may include avoidance of  supine sleep position along with weight loss, upper airway or jaw surgery in selected patients or the use of an oral appliance in certain patients. ENT evaluation and/or consultation with a maxillofacial surgeon or dentist may be feasible in some instances.    2. Please note that untreated obstructive sleep apnea may carries additional perioperative morbidity. Patients with significant obstructive sleep apnea should receive perioperative PAP therapy and the surgeons and particularly the anesthesiologist should be informed of the diagnosis and the severity of the sleep disordered breathing. 3. The patient should be cautioned not to drive, work at heights, or operate dangerous or heavy equipment when tired or sleepy. Review and reiteration of good sleep hygiene measures should be pursued with any patient. 4. The patient will be seen in follow-up by Dr. Rexene Alberts at [facility] for discussion of the test results and further management strategies. The referring provider will be notified of the test results.  I certify that I have reviewed the entire raw data recording prior to the issuance of this report in accordance with the Standards of Accreditation of the American Academy of Sleep Medicine (AASM)   Star Age, MD, PhD Diplomat, American Board of Neurology and Sleep Medicine (Neurology and Sleep Medicine)

## 2019-05-23 NOTE — Progress Notes (Signed)
Patient referred by Dr. Juleen China, seen by me on 04/15/19 for VV, diagnostic PSG on 05/21/19.    Please call and notify the patient that the recent sleep study showed rather mild obstructive sleep apnea. OSA is overall quite mild, but worth treating to see if she feels better after treatment.  She actually slept quite well, over 85% of the time tested and achieved all stages of sleep. Nevertheless, I recommend a trial of autoPAP, which means, that we don't have to bring her back for a second sleep study with CPAP, but will let him try an autoPAP machine at home, through a DME company (of her choice, or as per insurance requirement). The DME representative will educate her on how to use the machine, how to put the mask on, etc. I have placed an order in the chart. Please send referral, talk to patient, send report to referring MD. We will need a FU in sleep clinic for 10 weeks post-PAP set up, please arrange that with me or one of our NPs.  Alternatively, she can consider an oral appliance with the help of a dentist. She may improve her snoring with a little bit of wt loss, but she is clearly not obese.   Star Age, MD, PhD [facility])

## 2019-06-03 DIAGNOSIS — G4733 Obstructive sleep apnea (adult) (pediatric): Secondary | ICD-10-CM | POA: Diagnosis not present

## 2019-06-19 ENCOUNTER — Ambulatory Visit: Payer: Self-pay | Admitting: Family Medicine

## 2019-07-04 DIAGNOSIS — G4733 Obstructive sleep apnea (adult) (pediatric): Secondary | ICD-10-CM | POA: Diagnosis not present

## 2019-07-20 DIAGNOSIS — Z1159 Encounter for screening for other viral diseases: Secondary | ICD-10-CM | POA: Diagnosis not present

## 2019-07-24 ENCOUNTER — Ambulatory Visit: Payer: Self-pay | Admitting: Neurology

## 2019-08-04 DIAGNOSIS — G4733 Obstructive sleep apnea (adult) (pediatric): Secondary | ICD-10-CM | POA: Diagnosis not present

## 2019-08-09 ENCOUNTER — Other Ambulatory Visit: Payer: Self-pay

## 2019-08-09 ENCOUNTER — Ambulatory Visit (INDEPENDENT_AMBULATORY_CARE_PROVIDER_SITE_OTHER)
Admission: RE | Admit: 2019-08-09 | Discharge: 2019-08-09 | Disposition: A | Discharge status: Home / Self Care | Payer: Self-pay | Source: Ambulatory Visit | Attending: Family Medicine | Admitting: Family Medicine

## 2019-08-09 DIAGNOSIS — Z8249 Family history of ischemic heart disease and other diseases of the circulatory system: Secondary | ICD-10-CM

## 2019-08-09 DIAGNOSIS — E7800 Pure hypercholesterolemia, unspecified: Secondary | ICD-10-CM

## 2019-08-10 ENCOUNTER — Encounter: Payer: Self-pay | Admitting: Family Medicine

## 2019-08-10 DIAGNOSIS — R931 Abnormal findings on diagnostic imaging of heart and coronary circulation: Secondary | ICD-10-CM | POA: Insufficient documentation

## 2019-08-12 ENCOUNTER — Other Ambulatory Visit: Payer: Self-pay

## 2019-08-12 DIAGNOSIS — Z8249 Family history of ischemic heart disease and other diseases of the circulatory system: Secondary | ICD-10-CM

## 2019-08-12 DIAGNOSIS — E7800 Pure hypercholesterolemia, unspecified: Secondary | ICD-10-CM

## 2019-08-14 ENCOUNTER — Other Ambulatory Visit (INDEPENDENT_AMBULATORY_CARE_PROVIDER_SITE_OTHER): Payer: BC Managed Care – PPO

## 2019-08-14 ENCOUNTER — Other Ambulatory Visit: Payer: Self-pay

## 2019-08-14 DIAGNOSIS — E7800 Pure hypercholesterolemia, unspecified: Secondary | ICD-10-CM

## 2019-08-14 DIAGNOSIS — Z8249 Family history of ischemic heart disease and other diseases of the circulatory system: Secondary | ICD-10-CM | POA: Diagnosis not present

## 2019-08-14 LAB — LIPID PANEL
Cholesterol: 225 mg/dL — ABNORMAL HIGH (ref 0–200)
HDL: 53.2 mg/dL (ref >39.00)
LDL Cholesterol: 140 mg/dL — ABNORMAL HIGH (ref 0–99)
NonHDL: 171.82
Total CHOL/HDL Ratio: 4
Triglycerides: 157 mg/dL — ABNORMAL HIGH (ref 0.0–149.0)
VLDL: 31.4 mg/dL (ref 0.0–40.0)

## 2019-08-16 ENCOUNTER — Ambulatory Visit (INDEPENDENT_AMBULATORY_CARE_PROVIDER_SITE_OTHER): Payer: BC Managed Care – PPO | Admitting: Cardiology

## 2019-08-16 ENCOUNTER — Encounter: Payer: Self-pay | Admitting: Cardiology

## 2019-08-16 VITALS — BP 110/74 | HR 72 | Ht 62.0 in | Wt 154.0 lb

## 2019-08-16 DIAGNOSIS — E7800 Pure hypercholesterolemia, unspecified: Secondary | ICD-10-CM

## 2019-08-16 DIAGNOSIS — Z8249 Family history of ischemic heart disease and other diseases of the circulatory system: Secondary | ICD-10-CM | POA: Diagnosis not present

## 2019-08-16 DIAGNOSIS — R931 Abnormal findings on diagnostic imaging of heart and coronary circulation: Secondary | ICD-10-CM | POA: Diagnosis not present

## 2019-08-16 MED ORDER — ROSUVASTATIN CALCIUM 10 MG PO TABS: 10.0000 mg | ORAL_TABLET | Freq: Every day | ORAL | 1 refills | Status: DC

## 2019-08-16 NOTE — Progress Notes (Signed)
Cardiology Consultation:    Date:  08/16/2019   ID:  Rojelio Brenner Olden, DOB [birth year], MRN [id number]  PCP:  Briscoe Deutscher, DO  Cardiologist:  Jenne Campus, MD   Referring MD: Briscoe Deutscher, DO   Chief Complaint  Patient presents with  . Family History of heart disease  . Pure Hypercholesterolemia    History of Present Illness:    Alexis Walker is a 43 y.o. female who is being seen today for the evaluation of dyslipidemia at the request of Briscoe Deutscher, DO.  She is very worried about her health she does have multiple family members who suffer from premature coronary artery disease.  Her fasting lipid profile was done which showed high cholesterol as a part of evaluation she also got calcium score done which was elevated 989.  Overall she is asymptomatic no chest pain tightness squeezing pressure burning chest she can walk climb stairs with no difficulties basically the reason for the visit is to modify her risk factors for coronary artery disease. She never smoked. Does not drink  Past Medical History:  Diagnosis Date  . Acquired hypothyroidism 02/27/2019  . Blood in stool   . Chronic back pain 07/31/2018  . Elevated prolactin level ([place]) 02/27/2019  . Family history of heart disease 02/27/2019  . Family hx of ALS (amyotrophic lateral sclerosis), father 02/27/2019  . History of pituitary adenoma 02/27/2019  . Primary insomnia 02/27/2019  . Pure hypercholesterolemia 02/27/2019    Past Surgical History:  Procedure Laterality Date  . APPENDECTOMY  1990  . WRIST SURGERY  2018    Current Medications: Current Meds  Medication Sig  . Magnesium Gluconate (MAGNESIUM 27 PO) Take by mouth.  . Multiple Vitamin (MULTIVITAMIN) capsule Take 1 capsule by mouth daily.  . Norethin Ace-Eth Estrad-FE (TAYTULLA) 1-20 MG-MCG(24) CAPS Take by mouth.  . Probiotic Product (PROBIOTIC-10 PO) Take by mouth daily.  Marland Kitchen SYNTHROID 150 MCG tablet Take 150 mcg by mouth daily.  Marland Kitchen zolpidem (AMBIEN) 5 MG tablet TAKE 1  TABLET BY MOUTH ONCE DAILY AT BEDTIME AS NEEDED FOR SLEEP     Allergies:   Patient has no known allergies.   Social History   Socioeconomic History  . Marital status: Divorced    Spouse name: Not on file  . Number of children: Not on file  . Years of education: Not on file  . Highest education level: Not on file  Occupational History  . Occupation: Sport and exercise psychologist: [employer]    Comment: [place]  Social Needs  . Financial resource strain: Not on file  . Food insecurity    Worry: Not on file    Inability: Not on file  . Transportation needs    Medical: Not on file    Non-medical: Not on file  Tobacco Use  . Smoking status: Never Smoker  . Smokeless tobacco: Never Used  Substance and Sexual Activity  . Alcohol use: Yes    Comment: 1-2 a week  . Drug use: Never  . Sexual activity: Not on file  Lifestyle  . Physical activity    Days per week: Not on file    Minutes per session: Not on file  . Stress: Not on file  Relationships  . Social Herbalist on phone: Not on file    Gets together: Not on file    Attends religious service: Not on file    Active member of club or organization: Not on file  Attends meetings of clubs or organizations: Not on file    Relationship status: Not on file  Other Topics Concern  . Not on file  Social History Narrative  . Not on file     Family History: The patient's family history includes ALS in her father; Alcohol abuse in her maternal grandfather, mother, and sister; Arthritis in her paternal grandmother; Breast cancer in her paternal aunt; Drug abuse in her sister; Healthy in her sister; Heart attack in her maternal grandfather and paternal grandfather; Heart disease in her maternal grandfather, maternal grandmother, and paternal grandfather; Hypercalcemia in her father and maternal grandmother; Hyperlipidemia in her maternal grandfather and paternal grandfather; Hypertension in her paternal grandfather.  ROS:   Please see the history of present illness.    All 14 point review of systems negative except as described per history of present illness.  EKGs/Labs/Other Studies Reviewed:    The following studies were reviewed today: Calcium score is 989  EKG:  EKG is  ordered today.  The ekg ordered today demonstrates normal sinus rhythm, normal P interval, normal QS complex duration morphology no ST-T segment changes  Recent Labs: 08/17/2018: TSH 1.17  Recent Lipid Panel    Component Value Date/Time   CHOL 225 (H) 08/14/2019 0809   TRIG 157.0 (H) 08/14/2019 0809   HDL 53.20 08/14/2019 0809   CHOLHDL 4 08/14/2019 0809   VLDL 31.4 08/14/2019 0809   LDLCALC 140 (H) 08/14/2019 0809    Physical Exam:    VS:  BP 110/74   Pulse 72   Ht 5\' 2"  (1.575 m)   Wt 154 lb (69.9 kg)   SpO2 99%   BMI 28.17 kg/m     Wt Readings from Last 3 Encounters:  08/16/19 154 lb (69.9 kg)  04/05/19 149 lb (67.6 kg)  02/26/19 149 lb 9.6 oz (67.9 kg)     GEN:  Well nourished, well developed in no acute distress HEENT: Normal NECK: No JVD; No carotid bruits LYMPHATICS: No lymphadenopathy CARDIAC: RRR, no murmurs, no rubs, no gallops RESPIRATORY:  Clear to auscultation without rales, wheezing or rhonchi  ABDOMEN: Soft, non-tender, non-distended MUSCULOSKELETAL:  No edema; No deformity  SKIN: Warm and dry NEUROLOGIC:  Alert and oriented x 3 PSYCHIATRIC:  Normal affect   ASSESSMENT:    1. Pure hypercholesterolemia   2. Abnormal screening cardiac CT, 07/2019, Score 1, 93% for matched controls   3. Family history of heart disease    PLAN:    In order of problems listed above:  1. Dyslipidemia.  Clearly she can benefit from statin she does have multiple family members with coronary artery disease also calcium score which is 989.  I will initiate Crestor 10 mg daily fasting lipid profile AST ALT will be checked in about 6 weeks.  I will see her at the same time. 2. Abnormal calcium score which is  used mostly for prognostic reasons.  Likely she is asymptomatic.  I strongly suggest to exercise on a regular basis good diet we discussed basic of Mediterranean diet and benefits of this she is eager to start those exercises as well as taking care of her diet. 3. Family history of premature coronary artery disease.  Noted   Medication Adjustments/Labs and Tests Ordered: Current medicines are reviewed at length with the patient today.  Concerns regarding medicines are outlined above.  Orders Placed This Encounter  Procedures  . Lipid Profile  . AST  . ALT   Meds ordered this encounter  Medications  .  rosuvastatin (CRESTOR) 10 MG tablet    Sig: Take 1 tablet (10 mg total) by mouth daily.    Dispense:  90 tablet    Refill:  1    Signed, Park Liter, MD, [facility]. 08/16/2019 4:52 PM    [facility]

## 2019-08-16 NOTE — Patient Instructions (Addendum)
Medication Instructions:  Your physician has recommended you make the following change in your medication:   Start: Crestor 10 mg daily   If you need a refill on your cardiac medications before your next appointment, please call your pharmacy.   Lab work: Your physician recommends that you return for lab work in 6 weeks: FASTING LIPIDS, AST, ALT   If you have labs (blood work) drawn today and your tests are completely normal, you will receive your results only by: Marland Kitchen MyChart Message (if you have MyChart) OR . A paper copy in the mail If you have any lab test that is abnormal or we need to change your treatment, we will call you to review the results.  Testing/Procedures: None    Follow-Up: At [facility], you and your health needs are our priority.  As part of our continuing mission to provide you with exceptional heart care, we have created designated Provider Care Teams.  These Care Teams include your primary Cardiologist (physician) and Advanced Practice Providers (APPs -  Physician Assistants and Nurse Practitioners) who all work together to provide you with the care you need, when you need it. You will need a follow up appointment in 6 weeks.  Please call our office 2 months in advance to schedule this appointment.  You may see No primary care provider on file. or another member of our [employer] Provider Team in [place]: Shirlee More, MD . Jyl Heinz, MD  Any Other Special Instructions Will Be Listed Below (If Applicable).  Rosuvastatin Tablets What is this medicine? ROSUVASTATIN (roe SOO va sta tin) is known as a HMG-CoA reductase inhibitor or 'statin'. It lowers cholesterol and triglycerides in the blood. This drug may also reduce the risk of heart attack, stroke, or other health problems in patients with risk factors for heart disease. Diet and lifestyle changes are often used with this drug. This medicine may be used for other purposes; ask your health care  provider or pharmacist if you have questions. COMMON BRAND NAME(S): Crestor What should I tell my health care provider before I take this medicine? They need to know if you have any of these conditions:  diabetes  if you often drink alcohol  history of stroke  kidney disease  liver disease  muscle aches or weakness  thyroid disease  an unusual or allergic reaction to rosuvastatin, other medicines, foods, dyes, or preservatives  pregnant or trying to get pregnant  breast-feeding How should I use this medicine? Take this medicine by mouth with a glass of water. Follow the directions on the prescription label. Do not cut, crush or chew this medicine. You can take this medicine with or without food. Take your doses at regular intervals. Do not take your medicine more often than directed. Talk to your pediatrician regarding the use of this medicine in children. While this drug may be prescribed for children as young as 65 years old for selected conditions, precautions do apply. Overdosage: If you think you have taken too much of this medicine contact a poison control center or emergency room at once. NOTE: This medicine is only for you. Do not share this medicine with others. What if I miss a dose? If you miss a dose, take it as soon as you can. If your next dose is to be taken in less than 12 hours, then do not take the missed dose. Take the next dose at your regular time. Do not take double or extra doses. What may  interact with this medicine? Do not take this medicine with any of the following medications:  herbal medicines like red yeast rice This medicine may also interact with the following medications:  alcohol  antacids containing aluminum hydroxide or magnesium hydroxide  cyclosporine  other medicines for high cholesterol  some medicines for HIV infection  warfarin This list may not describe all possible interactions. Give your health care provider a list of all the  medicines, herbs, non-prescription drugs, or dietary supplements you use. Also tell them if you smoke, drink alcohol, or use illegal drugs. Some items may interact with your medicine. What should I watch for while using this medicine? Visit your doctor or health care professional for regular check-ups. You may need regular tests to make sure your liver is working properly. Your health care professional may tell you to stop taking this medicine if you develop muscle problems. If your muscle problems do not go away after stopping this medicine, contact your health care professional. Do not become pregnant while taking this medicine. Women should inform their health care professional if they wish to become pregnant or think they might be pregnant. There is a potential for serious side effects to an unborn child. Talk to your health care professional or pharmacist for more information. Do not breast-feed an infant while taking this medicine. This medicine may increase blood sugar. Ask your healthcare provider if changes in diet or medicines are needed if you have diabetes. If you are going to need surgery or other procedure, tell your doctor that you are using this medicine. This drug is only part of a total heart-health program. Your doctor or a dietician can suggest a low-cholesterol and low-fat diet to help. Avoid alcohol and smoking, and keep a proper exercise schedule. This medicine may cause a decrease in Co-Enzyme Q-10. You should make sure that you get enough Co-Enzyme Q-10 while you are taking this medicine. Discuss the foods you eat and the vitamins you take with your health care professional. What side effects may I notice from receiving this medicine? Side effects that you should report to your doctor or health care professional as soon as possible:  allergic reactions like skin rash, itching or hives, swelling of the face, lips, or tongue  confusion  joint pain  loss of memory  redness,  blistering, peeling or loosening of the skin, including inside the mouth  signs and symptoms of high blood sugar such as being more thirsty or hungry or having to urinate more than normal. You may also feel very tired or have blurry vision.  signs and symptoms of muscle injury like dark urine; trouble passing urine or change in the amount of urine; unusually weak or tired; muscle pain or side or back pain  yellowing of the eyes or skin Side effects that usually do not require medical attention (report to your doctor or health care professional if they continue or are bothersome):  constipation  diarrhea  dizziness  gas  headache  nausea  stomach pain  trouble sleeping  upset stomach This list may not describe all possible side effects. Call your doctor for medical advice about side effects. You may report side effects to FDA at 1-800-FDA-1088. Where should I keep my medicine? Keep out of the reach of children. Store at room temperature between 20 and 25 degrees C (68 and 77 degrees F). Keep container tightly closed (protect from moisture). Throw away any unused medicine after the expiration date. NOTE: This sheet is a  summary. It may not cover all possible information. If you have questions about this medicine, talk to your doctor, pharmacist, or health care provider.  2020 Elsevier/Gold Standard (2018-09-06 08:25:08)

## 2019-08-16 NOTE — Progress Notes (Signed)
cre 

## 2019-08-16 NOTE — Addendum Note (Signed)
Addended by: Ashok Norris on: 08/16/2019 05:10 PM   Modules accepted: Orders

## 2019-08-19 ENCOUNTER — Encounter: Payer: Self-pay | Admitting: Family Medicine

## 2019-08-19 DIAGNOSIS — Z1231 Encounter for screening mammogram for malignant neoplasm of breast: Secondary | ICD-10-CM | POA: Diagnosis not present

## 2019-08-21 ENCOUNTER — Ambulatory Visit (INDEPENDENT_AMBULATORY_CARE_PROVIDER_SITE_OTHER): Payer: BC Managed Care – PPO | Admitting: Neurology

## 2019-08-21 ENCOUNTER — Encounter: Payer: Self-pay | Admitting: Neurology

## 2019-08-21 ENCOUNTER — Other Ambulatory Visit: Payer: Self-pay

## 2019-08-21 VITALS — BP 127/73 | HR 73 | Ht 62.0 in | Wt 150.0 lb

## 2019-08-21 DIAGNOSIS — Z9989 Dependence on other enabling machines and devices: Secondary | ICD-10-CM | POA: Diagnosis not present

## 2019-08-21 DIAGNOSIS — G4733 Obstructive sleep apnea (adult) (pediatric): Secondary | ICD-10-CM

## 2019-08-21 NOTE — Progress Notes (Signed)
Subjective:    Patient ID: Alexis Walker is a 43 y.o. female.  HPI     Interim history:   Ms. Alexis Walker is a 43 year old right-handed woman with an underlying medical history of hyperlipidemia, history of pituitary adenoma, chronic back pain, hypothyroidism, and mildly overweight state, who presents for follow-up consultation of her sleep disorder, after recent sleep testing and starting therapy for mild sleep apnea.  The patient is unaccompanied today.  I first met her on 04/15/2019 in virtual visit, she was referred by her primary care physician at the time and reported snoring as well as difficulty with sleep maintenance and sleep disruption as well as daytime somnolence.  She had tried nonprescription and prescription sleep aids over the course of years.  She had a baseline sleep study on 05/21/2019.  We went over the test results together.  Her sleep latency was 16.5 minutes, sleep efficiency 87.5%, she had a mildly elevated percentage of stage II sleep, otherwise normal or near normal sleep stages.  Overall AHI was 7.6/h, average oxygen saturation 95%, nadir was briefly at 84% but otherwise at or above 90 for the rest of the night.  She was advised to start a trial of AutoPap therapy at home.  Her set up date was 06/03/2019.   Today, 08/21/2019: I reviewed her AutoPap compliance data from 07/21/2019 through 08/19/2019 which is a total of 30 days, during which time she used her AutoPap every night, one night did not register although she recalls using it.  It also did not register on her cell phone app.  Her percent use days greater than 4 hours was 93%, average usage of 6 hours and 53 minutes, residual AHI at goal at 0.3/h, pressure for the 95th percentile at 6.8 cm with a range of 5 cm to 10 cm.  She reports feeling better.  She feels that her nighttime sleep is more consolidated and better quality and she is not as tired during the day.  She is highly motivated to continue with treatment.  She is using a  nasal cradle interface and tolerates it well.  She was recently started on Crestor.  She has a history of hyperlipidemia and a family history of heart disease and hyperlipidemia.  She saw cardiology not too long ago, she had a interim coronary calcium study which showed an elevated score.  She has a follow-up soon with cardiology for blood work and recheck.  Thankfully, she has no heart related symptoms.  She is also working on lifestyle modification.    The patient's allergies, current medications, family history, past medical history, past social history, past surgical history and problem list were reviewed and updated as appropriate.   Previously:   04/15/2019: She is referred by her primary care physician, Dr. Briscoe Walker and I reviewed her virtual visit note from 04/05/2019.  Patient reports snoring which has been disturbing to herself, she wakes up sometimes from her own snoring.  She has had difficulty maintaining sleep and sleep disruption, daytime tiredness and exhaustion for years.  She had tried Ambien in the past which worked fairly well.  She has been on trazodone and was recently started on Belsomra.  Her Epworth sleepiness score is 7 out of 24, fatigue score is 36 out of 63.  She has had difficulty maintaining sleep for over 20 years.  Over the course of time she has tried over-the-counter medication including melatonin and p.m. type medications.  She usually uses a Paediatric nurse generic over-the-counter sleep aid  currently.  She has a prescription for Ambien 5 mg strength, tries not to take it every night, estimates that she takes it typically 2 nights out of the week currently.  It has been working well for her but she does not like to take it.  She tried trazodone which worked in the beginning, she tried it for about a month.  In the past she tried Industrial/product designer.  None of these medications worked for a long period of time.  She was not able to afford the Belsomra.She has no telltale family  history of sleep difficulties, father died young in his 68s from [place].  Her mother is about 57 and generally healthy.  She has no obvious family history of obstructive sleep apnea.  She lives with her boyfriend and reports some snoring, does not have any telltale symptoms of restless legs.She tries to be in bed around 930, likes to read on her Kindle for about 40 minutes to 60 minutes and then turns off the light, she does not watch TV in her bedroom and uses earplugs and an eye mask.She has no pets, no kids.  She does wake up to go to the bathroom typically 2 or 3 times on an average night, rise time for work is around 6.  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  Her Past Medical History Is Significant For: Past Medical History:  Diagnosis Date  . Acquired hypothyroidism 02/27/2019  . Blood in stool   . Chronic back pain 07/31/2018  . Elevated prolactin level ([place]) 02/27/2019  . Family history of heart disease 02/27/2019  . Family hx of ALS (amyotrophic lateral sclerosis), father 02/27/2019  . History of pituitary adenoma 02/27/2019  . Primary insomnia 02/27/2019  . Pure hypercholesterolemia 02/27/2019    Her Past Surgical History Is Significant For: Past Surgical History:  Procedure Laterality Date  . APPENDECTOMY  1990  . WRIST SURGERY  2018    Her Family History Is Significant For: Family History  Problem Relation Age of Onset  . Breast cancer Paternal Aunt   . Alcohol abuse Mother   . ALS Father   . Hypercalcemia Father   . Alcohol abuse Sister   . Drug abuse Sister   . Hypercalcemia Maternal Grandmother   . Heart disease Maternal Grandmother   . Alcohol abuse Maternal Grandfather   . Heart attack Maternal Grandfather   . Heart disease Maternal Grandfather   . Hyperlipidemia Maternal Grandfather   . Arthritis Paternal Grandmother   . Heart attack Paternal Grandfather   . Heart disease Paternal Grandfather   . Hypertension Paternal Grandfather   . Hyperlipidemia  Paternal Grandfather   . Healthy Sister     Her Social History Is Significant For: Social History   Socioeconomic History  . Marital status: Divorced    Spouse name: Not on file  . Number of children: Not on file  . Years of education: Not on file  . Highest education level: Not on file  Occupational History  . Occupation: Sport and exercise psychologist: [employer]    Comment: [employer]  Social Needs  . Financial resource strain: Not on file  . Food insecurity    Worry: Not on file    Inability: Not on file  . Transportation needs    Medical: Not on file    Non-medical: Not on file  Tobacco Use  . Smoking status: Never Smoker  . Smokeless tobacco: Never Used  Substance  and Sexual Activity  . Alcohol use: Yes    Comment: 1-2 a week  . Drug use: Never  . Sexual activity: Not on file  Lifestyle  . Physical activity    Days per week: Not on file    Minutes per session: Not on file  . Stress: Not on file  Relationships  . Social Herbalist on phone: Not on file    Gets together: Not on file    Attends religious service: Not on file    Active member of club or organization: Not on file    Attends meetings of clubs or organizations: Not on file    Relationship status: Not on file  Other Topics Concern  . Not on file  Social History Narrative  . Not on file    Her Allergies Are:  No Known Allergies:   Her Current Medications Are:  Outpatient Encounter Medications as of 08/21/2019  Medication Sig  . Magnesium Gluconate (MAGNESIUM 27 PO) Take by mouth.  . Multiple Vitamin (MULTIVITAMIN) capsule Take 1 capsule by mouth daily.  . Norethin Ace-Eth Estrad-FE (TAYTULLA) 1-20 MG-MCG(24) CAPS Take by mouth.  . Probiotic Product (PROBIOTIC-10 PO) Take by mouth daily.  . rosuvastatin (CRESTOR) 10 MG tablet Take 1 tablet (10 mg total) by mouth daily.  Marland Kitchen SYNTHROID 150 MCG tablet Take 150 mcg by mouth daily.  Marland Kitchen zolpidem (AMBIEN) 5 MG tablet TAKE 1 TABLET BY MOUTH  ONCE DAILY AT BEDTIME AS NEEDED FOR SLEEP   No facility-administered encounter medications on file as of 08/21/2019.   :  Review of Systems:  Out of a complete 14 point review of systems, all are reviewed and negative with the exception of these symptoms as listed below: Review of Systems  Neurological:       Pt presents today to discuss her auto pap. Pt likes her auto pap and feels better with treatment.    Objective:  Neurological Exam  Physical Exam Physical Examination:   Vitals:   08/21/19 0827  BP: 127/73  Pulse: 73    General Examination: The patient is a very pleasant 43 y.o. female in no acute distress. She appears well-developed and well-nourished and well groomed.   HEENT: Normocephalic, atraumatic, pupils are equal, round and reactive to light. Hearing is grossly intact, face is symmetric with normal facial animation, speech is clear without dysarthria, hypophonia or voice tremor.  Airway examination reveals Mallampati class I.  Tonsils are on the smaller side, 1-2+, normal uvula, normal tongue, tongue protrudes centrally and palate elevates symmetrically.  Chest: Clear to auscultation without wheezing, rhonchi or crackles noted.  Heart: S1+S2+0, regular and normal without murmurs, rubs or gallops noted.   Abdomen: Soft, non-tender and non-distended with normal bowel sounds appreciated on auscultation.  Extremities: There is no edema in the distal lower extremities bilaterally.  Skin: Warm and dry without obvious trophic changes.  Musculoskeletal: exam reveals no obvious joint deformities, tenderness or joint swelling or erythema.   Neurologically:  Mental status: The patient is awake, alert and oriented in all 4 spheres. Her immediate and remote memory, attention, language skills and fund of knowledge are appropriate. There is no evidence of aphasia, agnosia, apraxia or anomia. Speech is clear with normal prosody and enunciation. Thought process is linear. Mood is  normal and affect is normal.  Cranial nerves II - XII are as described above under HEENT exam. In addition: shoulder shrug is normal with equal shoulder height noted. Motor exam: Normal bulk,  strength and tone is noted. There is no tremor, Romberg is negative. Fine motor skills and coordination: grossly intact.  Cerebellar testing: No dysmetria or intention tremor. There is no truncal or gait ataxia.  Sensory exam: intact to light touch.  Gait, station and balance: She stands easily. No veering to one side is noted. No leaning to one side is noted. Posture is age-appropriate and stance is narrow based. Gait shows normal stride length and normal pace. No problems turning are noted. Tandem walk is unremarkable.                Assessment and Plan:  Ms. Alexis Walker is a 43 year old right-handed woman with an underlying medical history of hyperlipidemia, history of pituitary adenoma, chronic back pain, hypothyroidism, and mildly overweight state, who presents for follow up consultation of her sleep disturbance after A baseline sleep study in June and starting AutoPap therapy at home.  She has overall mild sleep apnea but has benefited quite significantly with AutoPap therapy, reports improvement in her sleep consolidation, sleep quality and daytime somnolence.  She is motivated to continue with treatment.  She has recently started seeing cardiology for hyperlipidemia and an abnormal coronary calcium score.  She has a follow-up pending.  From my end of things she is doing rather well.  She is advised to follow-up routinely in 6 months with a nurse practitioner.  She can follow-up yearly after that.  She is advised to call us with any interim questions or concerns.  I answered all her questions today and she was in agreement.

## 2019-08-21 NOTE — Patient Instructions (Addendum)
Please continue using your autoPAP regularly. While your insurance requires that you use PAP at least 4 hours each night on 70% of the nights, I recommend, that you not skip any nights and use it throughout the night if you can. Getting used to PAP and staying with the treatment long term does take time and patience and discipline. Untreated obstructive sleep apnea when it is moderate to severe can have an adverse impact on cardiovascular health and raise her risk for heart disease, arrhythmias, hypertension, congestive heart failure, stroke and diabetes. Untreated obstructive sleep apnea causes sleep disruption, nonrestorative sleep, and sleep deprivation. This can have an impact on your day to day functioning and cause daytime sleepiness and impairment of cognitive function, memory loss, mood disturbance, and problems focussing. Using PAP regularly can improve these symptoms.  Keep up the good work! We will see you in 6 months, you can see Jinny Blossom or Amy, NP.

## 2019-08-30 ENCOUNTER — Encounter: Payer: Self-pay | Admitting: Family Medicine

## 2019-08-30 ENCOUNTER — Other Ambulatory Visit: Payer: Self-pay

## 2019-08-30 ENCOUNTER — Ambulatory Visit (INDEPENDENT_AMBULATORY_CARE_PROVIDER_SITE_OTHER): Payer: BC Managed Care – PPO | Admitting: Family Medicine

## 2019-08-30 VITALS — BP 118/78 | HR 73 | Temp 98.2°F | Ht 62.0 in | Wt 150.4 lb

## 2019-08-30 DIAGNOSIS — Z9989 Dependence on other enabling machines and devices: Secondary | ICD-10-CM

## 2019-08-30 DIAGNOSIS — G4733 Obstructive sleep apnea (adult) (pediatric): Secondary | ICD-10-CM

## 2019-08-30 DIAGNOSIS — E7800 Pure hypercholesterolemia, unspecified: Secondary | ICD-10-CM | POA: Diagnosis not present

## 2019-08-30 DIAGNOSIS — R931 Abnormal findings on diagnostic imaging of heart and coronary circulation: Secondary | ICD-10-CM | POA: Diagnosis not present

## 2019-09-03 DIAGNOSIS — G4733 Obstructive sleep apnea (adult) (pediatric): Secondary | ICD-10-CM | POA: Diagnosis not present

## 2019-09-03 NOTE — Progress Notes (Signed)
Alexis Walker is a 43 y.o. female is here for follow up.  History of Present Illness:   HPI: High Cardiac CT score. Concerned that Cardiology was not as proactive as she had hoped. Taking statin. Would like a second opinion and to go to physician in [place] instead of [place].   Using CPAP. Feels that it has been very helpful.   Health Maintenance Due  Topic Date Due  . HIV Screening  07/03/1990   Depression screen [facility] 2/9 08/30/2019 02/26/2019  Decreased Interest 0 0  Down, Depressed, Hopeless 0 0  PHQ - 2 Score 0 0  Altered sleeping 1 3  Tired, decreased energy 0 3  Change in appetite 0 0  Feeling bad or failure about yourself  0 0  Trouble concentrating 0 0  Moving slowly or fidgety/restless 0 0  Suicidal thoughts 0 0  PHQ-9 Score 1 6  Difficult doing work/chores Not difficult at all Not difficult at all   PMHx, SurgHx, SocialHx, FamHx, Medications, and Allergies were reviewed in the Visit Navigator and updated as appropriate.   Patient Active Problem List   Diagnosis Date Noted  . OSA on CPAP 09/08/2019  . Abnormal screening cardiac CT, 07/2019, Score 1, 93% for matched controls 08/10/2019  . Pure hypercholesterolemia 02/27/2019  . Acquired hypothyroidism 02/27/2019  . Primary insomnia 02/27/2019  . Elevated prolactin level 02/27/2019  . History of pituitary adenoma 02/27/2019  . Family hx of ALS (amyotrophic lateral sclerosis), father 02/27/2019  . Family history of heart disease 02/27/2019  . Chronic back pain 07/31/2018   Social History   Tobacco Use  . Smoking status: Never Smoker  . Smokeless tobacco: Never Used  Substance Use Topics  . Alcohol use: Yes    Comment: 1-2 a week  . Drug use: Never   Current Medications and Allergies   Current Outpatient Medications:  Marland Kitchen  Magnesium Gluconate (MAGNESIUM 27 PO), Take by mouth., Disp: , Rfl:  .  Multiple Vitamin (MULTIVITAMIN) capsule, Take 1 capsule by mouth daily., Disp: , Rfl:  .  Norethin Ace-Eth Estrad-FE  (TAYTULLA) 1-20 MG-MCG(24) CAPS, Take by mouth., Disp: , Rfl:  .  Probiotic Product (PROBIOTIC-10 PO), Take by mouth daily., Disp: , Rfl:  .  rosuvastatin (CRESTOR) 10 MG tablet, Take 1 tablet (10 mg total) by mouth daily., Disp: 90 tablet, Rfl: 1 .  SYNTHROID 150 MCG tablet, Take 150 mcg by mouth daily., Disp: , Rfl:  .  zolpidem (AMBIEN) 5 MG tablet, TAKE 1 TABLET BY MOUTH ONCE DAILY AT BEDTIME AS NEEDED FOR SLEEP, Disp: 30 tablet, Rfl: 1  No Known Allergies   Review of Systems   Pertinent items are noted in the HPI. Otherwise, a complete ROS is negative.  Vitals   Vitals:   08/30/19 1444  BP: 118/78  Pulse: 73  Temp: 98.2 F (36.8 C)  TempSrc: Temporal  Weight: 150 lb 6.4 oz (68.2 kg)  Height: 5\' 2"  (1.575 m)     Body mass index is 27.51 kg/m.  Physical Exam   Physical Exam Vitals signs and nursing note reviewed.  HENT:     Head: Normocephalic and atraumatic.  Eyes:     Pupils: Pupils are equal, round, and reactive to light.  Neck:     Musculoskeletal: Normal range of motion and neck supple.  Cardiovascular:     Rate and Rhythm: Normal rate and regular rhythm.     Heart sounds: Normal heart sounds.  Pulmonary:     Effort:  Pulmonary effort is normal.  Abdominal:     Palpations: Abdomen is soft.  Skin:    General: Skin is warm.  Psychiatric:        Behavior: Behavior normal.     Results for orders placed or performed in visit on 08/14/19  Lipid panel  Result Value Ref Range   Cholesterol 225 (H) 0 - 200 mg/dL   Triglycerides 157.0 (H) 0.0 - 149.0 mg/dL   HDL 53.20 >39.00 mg/dL   VLDL 31.4 0.0 - 40.0 mg/dL   LDL Cholesterol 140 (H) 0 - 99 mg/dL   Total CHOL/HDL Ratio 4    NonHDL 171.82    The 10-year ASCVD risk score Mikey Bussing DC Jr., et al., 2013) is: 0.8%   Values used to calculate the score:     Age: 67 years     Sex: Female     Is Non-Hispanic African American: No     Diabetic: No     Tobacco smoker: No     Systolic Blood Pressure: 123456 mmHg     Is  BP treated: No     HDL Cholesterol: 53.2 mg/dL     Total Cholesterol: 225 mg/dL  EXAM: Coronary Calcium Score  TECHNIQUE: The patient was scanned on a Siemens Somatom 64 slice scanner. Axial non-contrast 3 mm slices were carried out through the heart. The data set was analyzed on a dedicated work station and scored using the [place].  FINDINGS: Non-cardiac: See separate report from [facility].  Ascending aorta: Normal  Pericardium: Normal  Coronary arteries: Normal origin  IMPRESSION: Coronary calcium score of .989. This was 70 percentile for age and sex matched control.  Assessment and Plan   Alexis Walker was seen today for follow-up.  Diagnoses and all orders for this visit:  Pure hypercholesterolemia  Abnormal screening cardiac CT, 07/2019, Score 1, 93% for matched controls Comments: Appreciate original consult, but will refer to [facility] for second opinion.  Orders: -     Comprehensive metabolic panel; Future -     Lipid panel; Future  OSA on CPAP   . Orders and follow up as documented in [place], reviewed diet, exercise and weight control, cardiovascular risk and specific lipid/LDL goals reviewed, reviewed medications and side effects in detail.  . Reviewed expectations re: course of current medical issues. . Outlined signs and symptoms indicating need for more acute intervention. . Patient verbalized understanding and all questions were answered. . Patient received an After Visit Summary.  Briscoe Deutscher, DO [place], [place] 09/08/2019

## 2019-09-08 ENCOUNTER — Encounter: Payer: Self-pay | Admitting: Family Medicine

## 2019-09-08 DIAGNOSIS — Z9989 Dependence on other enabling machines and devices: Secondary | ICD-10-CM | POA: Insufficient documentation

## 2019-09-08 DIAGNOSIS — G4733 Obstructive sleep apnea (adult) (pediatric): Secondary | ICD-10-CM | POA: Insufficient documentation

## 2019-09-09 ENCOUNTER — Telehealth: Payer: Self-pay | Admitting: Cardiology

## 2019-09-09 ENCOUNTER — Other Ambulatory Visit: Payer: Self-pay

## 2019-09-09 DIAGNOSIS — E039 Hypothyroidism, unspecified: Secondary | ICD-10-CM

## 2019-09-09 DIAGNOSIS — R7989 Other specified abnormal findings of blood chemistry: Secondary | ICD-10-CM

## 2019-09-09 DIAGNOSIS — Z820 Family history of epilepsy and other diseases of the nervous system: Secondary | ICD-10-CM

## 2019-09-09 DIAGNOSIS — Z8249 Family history of ischemic heart disease and other diseases of the circulatory system: Secondary | ICD-10-CM

## 2019-09-09 NOTE — Telephone Encounter (Signed)
New Message  Patient is wanting to transfer care from Dr. Agustin Cree to Dr. Meda Coffee. Patient would rather have a female physician and someone who has more of a focus on cardiology for women. Patient is wanting to do her November follow up with Dr. Meda Coffee. Would it be ok to schedule her follow up visit in November with Dr. Meda Coffee? Please let me know and I will call patient back and make change. Thanks.

## 2019-09-10 NOTE — Telephone Encounter (Signed)
Fine with me

## 2019-09-19 ENCOUNTER — Telehealth: Payer: Self-pay

## 2019-09-19 NOTE — Telephone Encounter (Signed)
Copied from [place] [id number]. Topic: General - Other >> Sep 19, 2019 12:42 PM Sheran Luz wrote: Patient states that she has been receiving MyChart messages from [facility]. She states she is not a patient at [facility] and inquired how to stop the messages.

## 2019-09-24 DIAGNOSIS — E039 Hypothyroidism, unspecified: Secondary | ICD-10-CM | POA: Diagnosis not present

## 2019-09-24 DIAGNOSIS — D352 Benign neoplasm of pituitary gland: Secondary | ICD-10-CM | POA: Diagnosis not present

## 2019-09-26 DIAGNOSIS — Z8739 Personal history of other diseases of the musculoskeletal system and connective tissue: Secondary | ICD-10-CM | POA: Insufficient documentation

## 2019-09-26 DIAGNOSIS — Z01419 Encounter for gynecological examination (general) (routine) without abnormal findings: Secondary | ICD-10-CM | POA: Diagnosis not present

## 2019-09-26 DIAGNOSIS — Z6827 Body mass index (BMI) 27.0-27.9, adult: Secondary | ICD-10-CM | POA: Diagnosis not present

## 2019-09-27 DIAGNOSIS — Z803 Family history of malignant neoplasm of breast: Secondary | ICD-10-CM | POA: Diagnosis not present

## 2019-09-27 DIAGNOSIS — Z801 Family history of malignant neoplasm of trachea, bronchus and lung: Secondary | ICD-10-CM | POA: Diagnosis not present

## 2019-10-04 ENCOUNTER — Other Ambulatory Visit: Payer: BC Managed Care – PPO

## 2019-10-04 ENCOUNTER — Ambulatory Visit: Payer: BC Managed Care – PPO | Admitting: Cardiology

## 2019-10-08 ENCOUNTER — Other Ambulatory Visit: Payer: Self-pay

## 2019-10-08 ENCOUNTER — Other Ambulatory Visit: Payer: BC Managed Care – PPO

## 2019-10-08 DIAGNOSIS — E7800 Pure hypercholesterolemia, unspecified: Secondary | ICD-10-CM | POA: Diagnosis not present

## 2019-10-08 DIAGNOSIS — R931 Abnormal findings on diagnostic imaging of heart and coronary circulation: Secondary | ICD-10-CM

## 2019-10-09 LAB — LIPID PANEL
Chol/HDL Ratio: 3 ratio (ref 0.0–4.4)
Cholesterol, Total: 163 mg/dL (ref 100–199)
HDL: 55 mg/dL (ref >39)
LDL Chol Calc (NIH): 85 mg/dL (ref 0–99)
Triglycerides: 129 mg/dL (ref 0–149)
VLDL Cholesterol Cal: 23 mg/dL (ref 5–40)

## 2019-10-09 LAB — ALT: ALT: 38 IU/L — ABNORMAL HIGH (ref 0–32)

## 2019-10-09 LAB — AST: AST: 23 IU/L (ref 0–40)

## 2019-10-14 NOTE — Progress Notes (Signed)
Cardiology Office Note    Date:  10/15/2019   ID:  Alexis Walker, DOB September 21, 1976, MRN [id number]  PCP:  Briscoe Deutscher, DO  Cardiologist: Ena Dawley, MD EPS: None  No chief complaint on file.   History of Present Illness:  Alexis Walker is a 43 y.o. female with history of hyperlipidemia, strong family history of early CAD, calcium score of 989.  Was followed by Dr. Agustin Cree but wants to switch to Dr. Meda Coffee which was approved.  Crestor 10 mg daily was started 08/16/2019.  She had no chest pain and a Mediterranean diet was recommended.  Patient comes in for f/u. Answered lots of questions surrounding calcium score.    Past Medical History:  Diagnosis Date  . Acquired hypothyroidism 02/27/2019  . Blood in stool   . Chronic back pain 07/31/2018  . Elevated prolactin level 02/27/2019  . Family history of heart disease 02/27/2019  . Family hx of ALS (amyotrophic lateral sclerosis), father 02/27/2019  . History of pituitary adenoma 02/27/2019  . Primary insomnia 02/27/2019  . Pure hypercholesterolemia 02/27/2019    Past Surgical History:  Procedure Laterality Date  . APPENDECTOMY  1990  . WRIST SURGERY  2018    Current Medications: Current Meds  Medication Sig  . Magnesium Gluconate (MAGNESIUM 27 PO) Take by mouth.  . Norethin Ace-Eth Estrad-FE (TAYTULLA) 1-20 MG-MCG(24) CAPS Take by mouth.  . Probiotic Product (PROBIOTIC-10 PO) Take by mouth daily.  . rosuvastatin (CRESTOR) 10 MG tablet Take 1 tablet (10 mg total) by mouth daily.  Marland Kitchen SYNTHROID 150 MCG tablet Take 150 mcg by mouth daily.  Marland Kitchen zolpidem (AMBIEN) 5 MG tablet TAKE 1 TABLET BY MOUTH ONCE DAILY AT BEDTIME AS NEEDED FOR SLEEP     Allergies:   Patient has no known allergies.   Social History   Socioeconomic History  . Marital status: Divorced    Spouse name: Not on file  . Number of children: Not on file  . Years of education: Not on file  . Highest education level: Not on file  Occupational History  . Occupation:  Sport and exercise psychologist: [employer]    Comment: [employer]  Social Needs  . Financial resource strain: Not on file  . Food insecurity    Worry: Not on file    Inability: Not on file  . Transportation needs    Medical: Not on file    Non-medical: Not on file  Tobacco Use  . Smoking status: Never Smoker  . Smokeless tobacco: Never Used  Substance and Sexual Activity  . Alcohol use: Yes    Comment: 1-2 a week  . Drug use: Never  . Sexual activity: Not on file  Lifestyle  . Physical activity    Days per week: Not on file    Minutes per session: Not on file  . Stress: Not on file  Relationships  . Social Herbalist on phone: Not on file    Gets together: Not on file    Attends religious service: Not on file    Active member of club or organization: Not on file    Attends meetings of clubs or organizations: Not on file    Relationship status: Not on file  Other Topics Concern  . Not on file  Social History Narrative  . Not on file     Family History:  The patient's family history includes ALS in her father; Alcohol abuse in her maternal grandfather, mother, and  sister; Arthritis in her paternal grandmother; Breast cancer in her paternal aunt; Drug abuse in her sister; Healthy in her sister; Heart attack in her maternal grandfather and paternal grandfather; Heart disease in her maternal grandfather, maternal grandmother, and paternal grandfather; Hypercalcemia in her father and maternal grandmother; Hyperlipidemia in her maternal grandfather and paternal grandfather; Hypertension in her paternal grandfather.   ROS:   Please see the history of present illness.    ROS All other systems reviewed and are negative.   PHYSICAL EXAM:   VS:  BP (!) 106/54   Pulse 85   Ht 5\' 2"  (1.575 m)   Wt 149 lb 6.4 oz (67.8 kg)   SpO2 99%   BMI 27.33 kg/m   Physical Exam  GEN: Well nourished, well developed, in no acute distress  Neck: no JVD, carotid bruits, or masses  Cardiac:RRR; no murmurs, rubs, or gallops  Respiratory:  clear to auscultation bilaterally, normal work of breathing GI: soft, nontender, nondistended, + BS Ext: without cyanosis, clubbing, or edema, Good distal pulses bilaterally Neuro:  Alert and Oriented x 3 Psych: euthymic mood, full affect  Wt Readings from Last 3 Encounters:  10/15/19 149 lb 6.4 oz (67.8 kg)  08/30/19 150 lb 6.4 oz (68.2 kg)  08/21/19 150 lb (68 kg)      Studies/Labs Reviewed:   EKG:  EKG is not ordered today.   Recent Labs: 10/08/2019: ALT 38   Lipid Panel    Component Value Date/Time   CHOL 163 10/08/2019 0810   TRIG 129 10/08/2019 0810   HDL 55 10/08/2019 0810   CHOLHDL 3.0 10/08/2019 0810   CHOLHDL 4 08/14/2019 0809   VLDL 31.4 08/14/2019 0809   LDLCALC 85 10/08/2019 0810    Additional studies/ records that were reviewed today include:  Calcium score 07/2019 FINDINGS: Non-cardiac: See separate report from [facility].   Ascending aorta: Normal   Pericardium: Normal   Coronary arteries: Normal origin   IMPRESSION: Coronary calcium score of .989. This was 85 percentile for age and sex matched control.   [name]     Electronically Signed   By: [name] M.D.   On: 08/09/2019 17:36      ASSESSMENT:    1. Pure hypercholesterolemia   2. Abnormal screening cardiac CT, 07/2019, Score 1, 93% for matched controls   3. Family history of heart disease      PLAN:  In order of problems listed above:  Hyperlipidemia LDL 140 triglycerides 157, Crestor 10 mg daily started 07/2019 repeat LDL down to 85 triglycerides 129 10/08/2019.  Continue current dose and repeat labs in 2 to 3 months.  Goal less than 70.  Elevated calcium score .989 which puts her at 93% for matched controls.  Exercise and diet recommended-she is doing the Noom diet and walking 1 mile/day.  Family history of premature CAD preventative measures discussed as above.  Follow-up with Dr. Meda Coffee in 6  months.    Medication Adjustments/Labs and Tests Ordered: Current medicines are reviewed at length with the patient today.  Concerns regarding medicines are outlined above.  Medication changes, Labs and Tests ordered today are listed in the Patient Instructions below. Patient Instructions  Medication Instructions:  Your physician recommends that you continue on your current medications as directed. Please refer to the Current Medication list given to you today.  *If you need a refill on your cardiac medications before your next appointment, please call your pharmacy*  Lab Work: Fasting lipid panel in 12/18/2019  If you have labs (blood work) drawn today and your tests are completely normal, you will receive your results only by: Marland Kitchen MyChart Message (if you have MyChart) OR . A paper copy in the mail If you have any lab test that is abnormal or we need to change your treatment, we will call you to review the results.  Testing/Procedures: None ordered.  Follow-Up: At [facility], you and your health needs are our priority.  As part of our continuing mission to provide you with exceptional heart care, we have created designated Provider Care Teams.  These Care Teams include your primary Cardiologist (physician) and Advanced Practice Providers (APPs -  Physician Assistants and Nurse Practitioners) who all work together to provide you with the care you need, when you need it.  Your next appointment:   6 months  The format for your next appointment:   Either In Person or Virtual  Provider:   You may see Ena Dawley, MD or one of the following Advanced Practice Providers on your designated Care Team:    Melina Copa, PA-C  Ermalinda Barrios, PA-C   Other Instructions  Your provider recommends that you maintain 150 minutes per week of moderate aerobic activity.      Sumner Boast, PA-C  10/15/2019 11:50 AM    [facility] [place],  [place], [place]  [address] Phone: [phone]; Fax: [phone]

## 2019-10-15 ENCOUNTER — Other Ambulatory Visit: Payer: Self-pay

## 2019-10-15 ENCOUNTER — Encounter: Payer: Self-pay | Admitting: Physician Assistant

## 2019-10-15 ENCOUNTER — Ambulatory Visit (INDEPENDENT_AMBULATORY_CARE_PROVIDER_SITE_OTHER): Payer: BC Managed Care – PPO | Admitting: Physician Assistant

## 2019-10-15 VITALS — BP 106/54 | HR 85 | Ht 62.0 in | Wt 149.4 lb

## 2019-10-15 DIAGNOSIS — E7800 Pure hypercholesterolemia, unspecified: Secondary | ICD-10-CM | POA: Diagnosis not present

## 2019-10-15 DIAGNOSIS — Z8249 Family history of ischemic heart disease and other diseases of the circulatory system: Secondary | ICD-10-CM | POA: Diagnosis not present

## 2019-10-15 DIAGNOSIS — R931 Abnormal findings on diagnostic imaging of heart and coronary circulation: Secondary | ICD-10-CM

## 2019-10-15 NOTE — Patient Instructions (Addendum)
Medication Instructions:  Your physician recommends that you continue on your current medications as directed. Please refer to the Current Medication list given to you today.  *If you need a refill on your cardiac medications before your next appointment, please call your pharmacy*  Lab Work: Fasting lipid panel in 12/18/2019  If you have labs (blood work) drawn today and your tests are completely normal, you will receive your results only by: Marland Kitchen MyChart Message (if you have MyChart) OR . A paper copy in the mail If you have any lab test that is abnormal or we need to change your treatment, we will call you to review the results.  Testing/Procedures: None ordered.  Follow-Up: At [facility], you and your health needs are our priority.  As part of our continuing mission to provide you with exceptional heart care, we have created designated Provider Care Teams.  These Care Teams include your primary Cardiologist (physician) and Advanced Practice Providers (APPs -  Physician Assistants and Nurse Practitioners) who all work together to provide you with the care you need, when you need it.  Your next appointment:   6 months  The format for your next appointment:   Either In Person or Virtual  Provider:   You may see Ena Dawley, MD or one of the following Advanced Practice Providers on your designated Care Team:    Melina Copa, PA-C  Ermalinda Barrios, PA-C   Other Instructions  Your provider recommends that you maintain 150 minutes per week of moderate aerobic activity.

## 2019-10-16 ENCOUNTER — Telehealth: Payer: Self-pay | Admitting: Emergency Medicine

## 2019-10-16 NOTE — Telephone Encounter (Signed)
Left message for patient to return call regarding results  

## 2019-10-19 DIAGNOSIS — M5136 Other intervertebral disc degeneration, lumbar region: Secondary | ICD-10-CM | POA: Diagnosis not present

## 2019-10-31 DIAGNOSIS — Z803 Family history of malignant neoplasm of breast: Secondary | ICD-10-CM | POA: Diagnosis not present

## 2019-10-31 DIAGNOSIS — Z9189 Other specified personal risk factors, not elsewhere classified: Secondary | ICD-10-CM | POA: Diagnosis not present

## 2019-11-01 NOTE — Telephone Encounter (Signed)
Called patient to give results from cholesterol labs. She reports she has switched to Dr. Meda Coffee and she will  Be managing this now. No further questions.

## 2019-11-04 ENCOUNTER — Other Ambulatory Visit: Payer: Self-pay | Admitting: Obstetrics and Gynecology

## 2019-11-04 DIAGNOSIS — M5136 Other intervertebral disc degeneration, lumbar region: Secondary | ICD-10-CM | POA: Insufficient documentation

## 2019-11-04 DIAGNOSIS — M51369 Other intervertebral disc degeneration, lumbar region without mention of lumbar back pain or lower extremity pain: Secondary | ICD-10-CM | POA: Insufficient documentation

## 2019-11-05 ENCOUNTER — Other Ambulatory Visit: Payer: Self-pay | Admitting: Obstetrics and Gynecology

## 2019-11-05 DIAGNOSIS — Z9189 Other specified personal risk factors, not elsewhere classified: Secondary | ICD-10-CM

## 2019-11-23 DIAGNOSIS — R509 Fever, unspecified: Secondary | ICD-10-CM | POA: Diagnosis not present

## 2019-11-23 DIAGNOSIS — Z20828 Contact with and (suspected) exposure to other viral communicable diseases: Secondary | ICD-10-CM | POA: Diagnosis not present

## 2019-12-06 ENCOUNTER — Other Ambulatory Visit: Payer: BC Managed Care – PPO

## 2019-12-18 ENCOUNTER — Other Ambulatory Visit: Payer: Self-pay

## 2019-12-18 ENCOUNTER — Other Ambulatory Visit: Payer: BC Managed Care – PPO | Admitting: *Deleted

## 2019-12-18 DIAGNOSIS — E7800 Pure hypercholesterolemia, unspecified: Secondary | ICD-10-CM

## 2019-12-18 DIAGNOSIS — R931 Abnormal findings on diagnostic imaging of heart and coronary circulation: Secondary | ICD-10-CM

## 2019-12-18 DIAGNOSIS — Z8249 Family history of ischemic heart disease and other diseases of the circulatory system: Secondary | ICD-10-CM

## 2019-12-18 LAB — LIPID PANEL
Chol/HDL Ratio: 2.8 ratio (ref 0.0–4.4)
Cholesterol, Total: 168 mg/dL (ref 100–199)
HDL: 59 mg/dL (ref >39)
LDL Chol Calc (NIH): 90 mg/dL (ref 0–99)
Triglycerides: 108 mg/dL (ref 0–149)
VLDL Cholesterol Cal: 19 mg/dL (ref 5–40)

## 2019-12-19 ENCOUNTER — Other Ambulatory Visit: Payer: Self-pay

## 2019-12-19 DIAGNOSIS — E7800 Pure hypercholesterolemia, unspecified: Secondary | ICD-10-CM

## 2019-12-19 MED ORDER — ROSUVASTATIN CALCIUM 20 MG PO TABS: 20.0000 mg | ORAL_TABLET | Freq: Every day | ORAL | 3 refills | Status: DC

## 2020-01-14 DIAGNOSIS — I2510 Atherosclerotic heart disease of native coronary artery without angina pectoris: Secondary | ICD-10-CM | POA: Diagnosis not present

## 2020-01-14 DIAGNOSIS — E221 Hyperprolactinemia: Secondary | ICD-10-CM | POA: Diagnosis not present

## 2020-01-14 DIAGNOSIS — Z1331 Encounter for screening for depression: Secondary | ICD-10-CM | POA: Diagnosis not present

## 2020-01-14 DIAGNOSIS — D352 Benign neoplasm of pituitary gland: Secondary | ICD-10-CM | POA: Diagnosis not present

## 2020-01-14 DIAGNOSIS — E039 Hypothyroidism, unspecified: Secondary | ICD-10-CM | POA: Diagnosis not present

## 2020-02-18 ENCOUNTER — Ambulatory Visit: Payer: BC Managed Care – PPO | Admitting: Family Medicine

## 2020-02-27 DIAGNOSIS — N3281 Overactive bladder: Secondary | ICD-10-CM | POA: Diagnosis not present

## 2020-02-27 DIAGNOSIS — R350 Frequency of micturition: Secondary | ICD-10-CM | POA: Diagnosis not present

## 2020-03-10 DIAGNOSIS — G4733 Obstructive sleep apnea (adult) (pediatric): Secondary | ICD-10-CM | POA: Diagnosis not present

## 2020-03-30 DIAGNOSIS — M25561 Pain in right knee: Secondary | ICD-10-CM | POA: Diagnosis not present

## 2020-04-03 ENCOUNTER — Other Ambulatory Visit: Payer: Self-pay

## 2020-04-03 ENCOUNTER — Other Ambulatory Visit: Payer: BC Managed Care – PPO | Admitting: *Deleted

## 2020-04-03 ENCOUNTER — Ambulatory Visit (INDEPENDENT_AMBULATORY_CARE_PROVIDER_SITE_OTHER): Payer: BC Managed Care – PPO | Admitting: Cardiology

## 2020-04-03 ENCOUNTER — Encounter: Payer: Self-pay | Admitting: Cardiology

## 2020-04-03 VITALS — BP 118/78 | HR 67 | Ht 62.0 in | Wt 151.2 lb

## 2020-04-03 DIAGNOSIS — E7800 Pure hypercholesterolemia, unspecified: Secondary | ICD-10-CM

## 2020-04-03 DIAGNOSIS — R931 Abnormal findings on diagnostic imaging of heart and coronary circulation: Secondary | ICD-10-CM | POA: Diagnosis not present

## 2020-04-03 DIAGNOSIS — Z8249 Family history of ischemic heart disease and other diseases of the circulatory system: Secondary | ICD-10-CM | POA: Diagnosis not present

## 2020-04-03 LAB — LIPID PANEL
Chol/HDL Ratio: 3 ratio (ref 0.0–4.4)
Cholesterol, Total: 167 mg/dL (ref 100–199)
HDL: 55 mg/dL (ref >39)
LDL Chol Calc (NIH): 90 mg/dL (ref 0–99)
Triglycerides: 124 mg/dL (ref 0–149)
VLDL Cholesterol Cal: 22 mg/dL (ref 5–40)

## 2020-04-03 NOTE — Progress Notes (Signed)
Cardiology Office Note    Date:  04/03/2020   ID:  Alexis Walker, DOB 01/23/1976, MRN [id number]  PCP:  Briscoe Deutscher, DO  Cardiologist: Ena Dawley, MD EPS: None  Reason for visit: 6 months follow-up, palpitations post COVID-19  History of Present Illness:  Alexis Walker is a 44 y.o. female with history of hyperlipidemia, strong family history of early CAD, calcium score of 989.  Was followed by Dr. Agustin Cree but wants to switch to Dr. Meda Coffee which was approved. She was started on Crestor 20 mg daily that she tolerates well however occasionally she gets muscle pains.  She is very active and tries to exercise on a regular basis however has been experiencing knee pain from arthritis and is undergoing physical therapy.  She denies any exertional chest pain shortness of breath or dizziness.  No lower extremity edema.  The patient had COVID-19 infection in December with respiratory symptoms however did not require hospitalization.  During the time she was experiencing palpitations based on her description seems like frequent PVCs.  They persisted for several months after acute illness but now are resolved.  Past Medical History:  Diagnosis Date  . Acquired hypothyroidism 02/27/2019  . Blood in stool   . Chronic back pain 07/31/2018  . Elevated prolactin level 02/27/2019  . Family history of heart disease 02/27/2019  . Family hx of ALS (amyotrophic lateral sclerosis), father 02/27/2019  . History of pituitary adenoma 02/27/2019  . Primary insomnia 02/27/2019  . Pure hypercholesterolemia 02/27/2019   Past Surgical History:  Procedure Laterality Date  . APPENDECTOMY  1990  . WRIST SURGERY  2018   Current Medications: Current Meds  Medication Sig  . oxybutynin (DITROPAN-XL) 10 MG 24 hr tablet Take 10 mg by mouth at bedtime.    Allergies:   Patient has no known allergies.   Social History   Socioeconomic History  . Marital status: Divorced    Spouse name: Not on file  . Number of children: Not on  file  . Years of education: Not on file  . Highest education level: Not on file  Occupational History  . Occupation: Sport and exercise psychologist: [employer]    Comment: [employer]  Tobacco Use  . Smoking status: Never Smoker  . Smokeless tobacco: Never Used  Substance and Sexual Activity  . Alcohol use: Yes    Comment: 1-2 a week  . Drug use: Never  . Sexual activity: Not on file  Other Topics Concern  . Not on file  Social History Narrative  . Not on file   Social Determinants of Health   Financial Resource Strain:   . Difficulty of Paying Living Expenses:   Food Insecurity:   . Worried About Charity fundraiser in the Last Year:   . Arboriculturist in the Last Year:   Transportation Needs:   . Film/video editor (Medical):   Marland Kitchen Lack of Transportation (Non-Medical):   Physical Activity:   . Days of Exercise per Week:   . Minutes of Exercise per Session:   Stress:   . Feeling of Stress :   Social Connections:   . Frequency of Communication with Friends and Family:   . Frequency of Social Gatherings with Friends and Family:   . Attends Religious Services:   . Active Member of Clubs or Organizations:   . Attends Archivist Meetings:   Marland Kitchen Marital Status:     Family History:  The patient's family  history includes ALS in her father; Alcohol abuse in her maternal grandfather, mother, and sister; Arthritis in her paternal grandmother; Breast cancer in her paternal aunt; Drug abuse in her sister; Healthy in her sister; Heart attack in her maternal grandfather and paternal grandfather; Heart disease in her maternal grandfather, maternal grandmother, and paternal grandfather; Hypercalcemia in her father and maternal grandmother; Hyperlipidemia in her maternal grandfather and paternal grandfather; Hypertension in her paternal grandfather.   ROS:   Please see the history of present illness.    ROS All other systems reviewed and are negative.   PHYSICAL EXAM:    VS:  BP 118/78   Pulse 67   Ht 5\' 2"  (1.575 m)   Wt 151 lb 3.2 oz (68.6 kg)   SpO2 98%   BMI 27.65 kg/m   Physical Exam  GEN: Well nourished, well developed, in no acute distress  Neck: no JVD, carotid bruits, or masses Cardiac:RRR; no murmurs, rubs, or gallops  Respiratory:  clear to auscultation bilaterally, normal work of breathing GI: soft, nontender, nondistended, + BS Ext: without cyanosis, clubbing, or edema, Good distal pulses bilaterally Neuro:  Alert and Oriented x 3 Psych: euthymic mood, full affect  Wt Readings from Last 3 Encounters:  04/03/20 151 lb 3.2 oz (68.6 kg)  10/15/19 149 lb 6.4 oz (67.8 kg)  08/30/19 150 lb 6.4 oz (68.2 kg)     Studies/Labs Reviewed:   EKG:  EKG is ordered today.  Normal sinus rhythm, 60 bpm, normal EKG, unchanged from prior this was personally reviewed.  Recent Labs: 10/08/2019: ALT 38   Lipid Panel    Component Value Date/Time   CHOL 168 12/18/2019 0808   TRIG 108 12/18/2019 0808   HDL 59 12/18/2019 0808   CHOLHDL 2.8 12/18/2019 0808   CHOLHDL 4 08/14/2019 0809   VLDL 31.4 08/14/2019 0809   LDLCALC 90 12/18/2019 0808    Additional studies/ records that were reviewed today include:  Calcium score 07/2019 FINDINGS: Non-cardiac: See separate report from [facility].   Ascending aorta: Normal   Pericardium: Normal   Coronary arteries: Normal origin   IMPRESSION: Coronary calcium score of .989. This was 65 percentile for age and sex matched control.   ASSESSMENT:    1. Pure hypercholesterolemia   2. Abnormal screening cardiac CT   3. Family history of heart disease    PLAN:  In order of problems listed above:  Hyperlipidemia LDL 140 triglycerides 157, Crestor 20 mg daily started with only occasional myalgias, her blood work was obtained today, we will follow the results.  In the future we might consider alternative as she is young and very active in my experience side effects from statins.  Goal for LDL  would be less than 70.    Elevated calcium score .989 which puts her at 93% for matched controls.  Exercise and diet recommended-she is doing the Noom diet and walking 1 mile/day.  Family history of premature CAD preventative measures discussed as above.    Palpitation post COVID-19 acute illness, we will obtain an echocardiogram.  Medication Adjustments/Labs and Tests Ordered: Current medicines are reviewed at length with the patient today.  Concerns regarding medicines are outlined above.  Medication changes, Labs and Tests ordered today are listed in the Patient Instructions below. Patient Instructions  Medication Instructions:  Your physician recommends that you continue on your current medications as directed. Please refer to the Current Medication list given to you today.  *If you need a refill on your  cardiac medications before your next appointment, please call your pharmacy*   Testing/Procedures: Your physician has requested that you have an echocardiogram. Echocardiography is a painless test that uses sound waves to create images of your heart. It provides your doctor with information about the size and shape of your heart and how well your heart's chambers and valves are working. This procedure takes approximately one hour. There are no restrictions for this procedure.  Follow-Up: At [facility], you and your health needs are our priority.  As part of our continuing mission to provide you with exceptional heart care, we have created designated Provider Care Teams.  These Care Teams include your primary Cardiologist (physician) and Advanced Practice Providers (APPs -  Physician Assistants and Nurse Practitioners) who all work together to provide you with the care you need, when you need it.  Your next appointment:   1 year(s)  The format for your next appointment:   In Person  Provider:   You may see Ena Dawley, MD or one of the following Advanced Practice Providers on  your designated Care Team:    Melina Copa, PA-C  Ermalinda Barrios, PA-C     Signed, Ena Dawley, MD  04/03/2020 8:48 AM    [address], [place], [place]  [address] Phone: [phone]; Fax: [phone]

## 2020-04-03 NOTE — Patient Instructions (Signed)
Medication Instructions:  Your physician recommends that you continue on your current medications as directed. Please refer to the Current Medication list given to you today.  *If you need a refill on your cardiac medications before your next appointment, please call your pharmacy*   Testing/Procedures: Your physician has requested that you have an echocardiogram. Echocardiography is a painless test that uses sound waves to create images of your heart. It provides your doctor with information about the size and shape of your heart and how well your heart's chambers and valves are working. This procedure takes approximately one hour. There are no restrictions for this procedure.  Follow-Up: At [facility], you and your health needs are our priority.  As part of our continuing mission to provide you with exceptional heart care, we have created designated Provider Care Teams.  These Care Teams include your primary Cardiologist (physician) and Advanced Practice Providers (APPs -  Physician Assistants and Nurse Practitioners) who all work together to provide you with the care you need, when you need it.  Your next appointment:   1 year(s)  The format for your next appointment:   In Person  Provider:   You may see Ena Dawley, MD or one of the following Advanced Practice Providers on your designated Care Team:    Melina Copa, PA-C  Ermalinda Barrios, PA-C

## 2020-04-07 DIAGNOSIS — M25561 Pain in right knee: Secondary | ICD-10-CM | POA: Diagnosis not present

## 2020-04-24 ENCOUNTER — Other Ambulatory Visit: Payer: Self-pay

## 2020-04-24 ENCOUNTER — Ambulatory Visit (HOSPITAL_COMMUNITY): Payer: BC Managed Care – PPO | Attending: Cardiology

## 2020-04-24 DIAGNOSIS — E7800 Pure hypercholesterolemia, unspecified: Secondary | ICD-10-CM | POA: Insufficient documentation

## 2020-04-24 DIAGNOSIS — R931 Abnormal findings on diagnostic imaging of heart and coronary circulation: Secondary | ICD-10-CM | POA: Diagnosis not present

## 2020-04-24 DIAGNOSIS — Z8249 Family history of ischemic heart disease and other diseases of the circulatory system: Secondary | ICD-10-CM | POA: Diagnosis not present

## 2020-05-06 DIAGNOSIS — M47816 Spondylosis without myelopathy or radiculopathy, lumbar region: Secondary | ICD-10-CM | POA: Diagnosis not present

## 2020-05-06 DIAGNOSIS — M791 Myalgia, unspecified site: Secondary | ICD-10-CM | POA: Diagnosis not present

## 2020-05-06 DIAGNOSIS — M5136 Other intervertebral disc degeneration, lumbar region: Secondary | ICD-10-CM | POA: Diagnosis not present

## 2020-05-21 ENCOUNTER — Telehealth: Payer: Self-pay | Admitting: Radiology

## 2020-05-21 DIAGNOSIS — R002 Palpitations: Secondary | ICD-10-CM

## 2020-05-21 NOTE — Telephone Encounter (Signed)
Alexis Spark, MD  You 13 minutes ago (4:02 PM)   Can you arrange for 2 week zio patch monitor ?   Message text       Notified the pt via mychart that per Dr. Meda Coffee, we will order for her to get a zio monitor for palpitations. Informed the pt that I will place the order in the system and send a message to our monitor tech to enroll her and mail this to her address on file.

## 2020-05-21 NOTE — Telephone Encounter (Signed)
Enrolled patient for a 14 day Zio monitor to be mailed to patients home.  

## 2020-06-13 DIAGNOSIS — M47816 Spondylosis without myelopathy or radiculopathy, lumbar region: Secondary | ICD-10-CM | POA: Diagnosis not present

## 2020-07-17 ENCOUNTER — Ambulatory Visit
Admission: RE | Admit: 2020-07-17 | Discharge: 2020-07-17 | Disposition: A | Discharge status: Home / Self Care | Payer: BC Managed Care – PPO | Source: Ambulatory Visit | Attending: Obstetrics and Gynecology | Admitting: Obstetrics and Gynecology

## 2020-07-17 ENCOUNTER — Other Ambulatory Visit: Payer: Self-pay

## 2020-07-17 DIAGNOSIS — Z9189 Other specified personal risk factors, not elsewhere classified: Secondary | ICD-10-CM

## 2020-07-17 DIAGNOSIS — N6489 Other specified disorders of breast: Secondary | ICD-10-CM | POA: Diagnosis not present

## 2020-07-17 MED ORDER — GADOBUTROL 1 MMOL/ML IV SOLN
6.0000 mL | Freq: Once | INTRAVENOUS | Status: AC | PRN
  Administered 2020-07-17: 6 mL via INTRAVENOUS

## 2020-07-20 ENCOUNTER — Other Ambulatory Visit: Payer: Self-pay | Admitting: Obstetrics and Gynecology

## 2020-07-20 DIAGNOSIS — R9389 Abnormal findings on diagnostic imaging of other specified body structures: Secondary | ICD-10-CM

## 2020-07-29 ENCOUNTER — Ambulatory Visit
Admission: RE | Admit: 2020-07-29 | Discharge: 2020-07-29 | Disposition: A | Discharge status: Home / Self Care | Payer: BC Managed Care – PPO | Source: Ambulatory Visit | Attending: Obstetrics and Gynecology | Admitting: Obstetrics and Gynecology

## 2020-07-29 ENCOUNTER — Other Ambulatory Visit: Payer: Self-pay

## 2020-07-29 ENCOUNTER — Other Ambulatory Visit: Payer: Self-pay | Admitting: Body Imaging

## 2020-07-29 DIAGNOSIS — R9389 Abnormal findings on diagnostic imaging of other specified body structures: Secondary | ICD-10-CM

## 2020-07-29 DIAGNOSIS — E039 Hypothyroidism, unspecified: Secondary | ICD-10-CM | POA: Diagnosis not present

## 2020-07-29 DIAGNOSIS — D352 Benign neoplasm of pituitary gland: Secondary | ICD-10-CM | POA: Diagnosis not present

## 2020-07-29 DIAGNOSIS — R928 Other abnormal and inconclusive findings on diagnostic imaging of breast: Secondary | ICD-10-CM | POA: Diagnosis not present

## 2020-07-29 DIAGNOSIS — N6011 Diffuse cystic mastopathy of right breast: Secondary | ICD-10-CM | POA: Diagnosis not present

## 2020-07-29 MED ORDER — GADOBUTROL 1 MMOL/ML IV SOLN
7.0000 mL | Freq: Once | INTRAVENOUS | Status: AC | PRN
  Administered 2020-07-29: 7 mL via INTRAVENOUS

## 2020-08-27 DIAGNOSIS — Z1231 Encounter for screening mammogram for malignant neoplasm of breast: Secondary | ICD-10-CM | POA: Diagnosis not present

## 2020-09-09 DIAGNOSIS — M79645 Pain in left finger(s): Secondary | ICD-10-CM | POA: Diagnosis not present

## 2020-09-11 DIAGNOSIS — N3281 Overactive bladder: Secondary | ICD-10-CM | POA: Insufficient documentation

## 2020-09-11 DIAGNOSIS — Z309 Encounter for contraceptive management, unspecified: Secondary | ICD-10-CM | POA: Diagnosis not present

## 2020-09-16 DIAGNOSIS — Z3202 Encounter for pregnancy test, result negative: Secondary | ICD-10-CM | POA: Diagnosis not present

## 2020-09-16 DIAGNOSIS — Z30430 Encounter for insertion of intrauterine contraceptive device: Secondary | ICD-10-CM | POA: Diagnosis not present

## 2020-10-29 DIAGNOSIS — Z1159 Encounter for screening for other viral diseases: Secondary | ICD-10-CM | POA: Diagnosis not present

## 2020-11-17 DIAGNOSIS — Z79899 Other long term (current) drug therapy: Secondary | ICD-10-CM | POA: Diagnosis not present

## 2020-11-17 DIAGNOSIS — R799 Abnormal finding of blood chemistry, unspecified: Secondary | ICD-10-CM | POA: Diagnosis not present

## 2020-11-17 DIAGNOSIS — R350 Frequency of micturition: Secondary | ICD-10-CM | POA: Diagnosis not present

## 2020-11-17 DIAGNOSIS — Z01419 Encounter for gynecological examination (general) (routine) without abnormal findings: Secondary | ICD-10-CM | POA: Diagnosis not present

## 2020-11-17 DIAGNOSIS — Z8679 Personal history of other diseases of the circulatory system: Secondary | ICD-10-CM | POA: Diagnosis not present

## 2020-11-17 DIAGNOSIS — Z793 Long term (current) use of hormonal contraceptives: Secondary | ICD-10-CM | POA: Diagnosis not present

## 2020-12-09 ENCOUNTER — Encounter (INDEPENDENT_AMBULATORY_CARE_PROVIDER_SITE_OTHER): Payer: Self-pay

## 2020-12-09 ENCOUNTER — Telehealth: Payer: Self-pay | Admitting: Cardiology

## 2020-12-09 DIAGNOSIS — Z8249 Family history of ischemic heart disease and other diseases of the circulatory system: Secondary | ICD-10-CM

## 2020-12-09 DIAGNOSIS — E7800 Pure hypercholesterolemia, unspecified: Secondary | ICD-10-CM

## 2020-12-09 DIAGNOSIS — E039 Hypothyroidism, unspecified: Secondary | ICD-10-CM

## 2020-12-09 DIAGNOSIS — Z79899 Other long term (current) drug therapy: Secondary | ICD-10-CM

## 2020-12-09 DIAGNOSIS — R931 Abnormal findings on diagnostic imaging of heart and coronary circulation: Secondary | ICD-10-CM

## 2020-12-09 NOTE — Telephone Encounter (Signed)
  Patient would like to know if she will need lab work done before her 1 yr f/u appt. I did not see orders for any labs. Patient would like to go ahead and schedule for labs if orders could be placed.

## 2020-12-09 NOTE — Telephone Encounter (Signed)
Spoke with the pt and arranged for her to have her labs done5 prior to her 1 yr follow-up appt with Dr. Meda Coffee on 5/19. Pt will come into the office and have her labs done to check CMET, CBC, and LIPIDS on Monday 5/16 and see Dr. Meda Coffee for office visit on 5/19. Pt states she will have her TSH level checked by her Endocrinologist in March 2022.  Pt is aware to come fasting to this lab appointment.   Pt verbalized understanding and agrees with this plan.

## 2021-01-13 ENCOUNTER — Telehealth: Payer: Self-pay | Admitting: Cardiology

## 2021-01-13 MED ORDER — ROSUVASTATIN CALCIUM 20 MG PO TABS
20.0000 mg | ORAL_TABLET | Freq: Every day | ORAL | 0 refills | Status: DC
Start: 2021-01-13 — End: 2021-05-17

## 2021-01-13 NOTE — Telephone Encounter (Signed)
*  STAT* If patient is at the pharmacy, call can be transferred to refill team.   1. Which medications need to be refilled? (please list name of each medication and dose if known) rosuvastatin (CRESTOR) 20 MG tablet  2. Which pharmacy/location (including street and city if local pharmacy) is medication to be sent to? [facility], [address]  3. Do they need a 30 day or 90 day supply? [place]

## 2021-01-13 NOTE — Telephone Encounter (Signed)
Pt's medication was sent to pt's pharmacy as requested. Confirmation received.  °

## 2021-01-20 DIAGNOSIS — M65312 Trigger thumb, left thumb: Secondary | ICD-10-CM | POA: Diagnosis not present

## 2021-01-20 DIAGNOSIS — M79645 Pain in left finger(s): Secondary | ICD-10-CM | POA: Diagnosis not present

## 2021-01-20 DIAGNOSIS — M25522 Pain in left elbow: Secondary | ICD-10-CM | POA: Diagnosis not present

## 2021-01-20 DIAGNOSIS — M79642 Pain in left hand: Secondary | ICD-10-CM | POA: Diagnosis not present

## 2021-01-29 DIAGNOSIS — D352 Benign neoplasm of pituitary gland: Secondary | ICD-10-CM | POA: Diagnosis not present

## 2021-01-29 DIAGNOSIS — E039 Hypothyroidism, unspecified: Secondary | ICD-10-CM | POA: Diagnosis not present

## 2021-01-29 DIAGNOSIS — E221 Hyperprolactinemia: Secondary | ICD-10-CM | POA: Diagnosis not present

## 2021-02-25 DIAGNOSIS — E559 Vitamin D deficiency, unspecified: Secondary | ICD-10-CM | POA: Diagnosis not present

## 2021-03-11 NOTE — Telephone Encounter (Signed)
Monitor was never returned/worn. Order will be cancelled.

## 2021-04-12 ENCOUNTER — Other Ambulatory Visit: Payer: BC Managed Care – PPO | Admitting: *Deleted

## 2021-04-12 ENCOUNTER — Other Ambulatory Visit: Payer: Self-pay

## 2021-04-12 DIAGNOSIS — Z8249 Family history of ischemic heart disease and other diseases of the circulatory system: Secondary | ICD-10-CM

## 2021-04-12 DIAGNOSIS — E7800 Pure hypercholesterolemia, unspecified: Secondary | ICD-10-CM

## 2021-04-12 DIAGNOSIS — E039 Hypothyroidism, unspecified: Secondary | ICD-10-CM

## 2021-04-12 DIAGNOSIS — Z79899 Other long term (current) drug therapy: Secondary | ICD-10-CM

## 2021-04-12 DIAGNOSIS — R931 Abnormal findings on diagnostic imaging of heart and coronary circulation: Secondary | ICD-10-CM

## 2021-04-12 LAB — LIPID PANEL
Chol/HDL Ratio: 2.7 ratio (ref 0.0–4.4)
Cholesterol, Total: 169 mg/dL (ref 100–199)
HDL: 63 mg/dL (ref >39)
LDL Chol Calc (NIH): 85 mg/dL (ref 0–99)
Triglycerides: 120 mg/dL (ref 0–149)
VLDL Cholesterol Cal: 21 mg/dL (ref 5–40)

## 2021-04-12 LAB — CBC
Hematocrit: 41.1 % (ref 34.0–46.6)
Hemoglobin: 13.7 g/dL (ref 11.1–15.9)
MCH: 30.2 pg (ref 26.6–33.0)
MCHC: 33.3 g/dL (ref 31.5–35.7)
MCV: 91 fL (ref 79–97)
Platelets: 266 10*3/uL (ref 150–450)
RBC: 4.54 x10E6/uL (ref 3.77–5.28)
RDW: 12.2 % (ref 11.7–15.4)
WBC: 9.2 10*3/uL (ref 3.4–10.8)

## 2021-04-12 LAB — COMPREHENSIVE METABOLIC PANEL
ALT: 24 IU/L (ref 0–32)
AST: 21 IU/L (ref 0–40)
Albumin/Globulin Ratio: 2 (ref 1.2–2.2)
Albumin: 4.5 g/dL (ref 3.8–4.8)
Alkaline Phosphatase: 73 IU/L (ref 44–121)
BUN/Creatinine Ratio: 17 (ref 9–23)
BUN: 13 mg/dL (ref 6–24)
Bilirubin Total: 0.4 mg/dL (ref 0.0–1.2)
CO2: 23 mmol/L (ref 20–29)
Calcium: 9.5 mg/dL (ref 8.7–10.2)
Chloride: 103 mmol/L (ref 96–106)
Creatinine, Ser: 0.76 mg/dL (ref 0.57–1.00)
Globulin, Total: 2.3 g/dL (ref 1.5–4.5)
Glucose: 91 mg/dL (ref 65–99)
Potassium: 4.3 mmol/L (ref 3.5–5.2)
Sodium: 139 mmol/L (ref 134–144)
Total Protein: 6.8 g/dL (ref 6.0–8.5)
eGFR: 98 mL/min/{1.73_m2} (ref >59)

## 2021-04-13 NOTE — Progress Notes (Signed)
Cardiology Office Note:    Date:  04/16/2021   ID:  Alexis Walker, DOB Feb 16, 1976, MRN [id number]  PCP:  Reynold Bowen, MD   [facility] Providers Cardiologist:  Ena Dawley, MD (Inactive) {   Referring MD: Reynold Bowen, MD     History of Present Illness:    Alexis Walker is a 45 y.o. female with a hx of hyperlipidemia, strong family history of early CAD, and calcium score of 989 who was previously followed by Dr. Meda Coffee who now presents to clinic for follow-up.  Last seen by Dr. Meda Coffee in 03/2020 where she was doing well. Remained active and tolerating medications.  Today, the patient states she feels well. No chest pain, SOB, lightheadedness, or dizziness. Occasional palpitations, but not bothersome. Patient is active and walks at least 10,000 steps per day. No exertional symptoms. TC, 169, HDL 63, LDL 85, TG 120. Currently on crestor 20mg  daily.  Past Medical History:  Diagnosis Date  . Acquired hypothyroidism 02/27/2019  . Blood in stool   . Chronic back pain 07/31/2018  . Elevated prolactin level 02/27/2019  . Family history of heart disease 02/27/2019  . Family hx of ALS (amyotrophic lateral sclerosis), father 02/27/2019  . History of pituitary adenoma 02/27/2019  . Primary insomnia 02/27/2019  . Pure hypercholesterolemia 02/27/2019    Past Surgical History:  Procedure Laterality Date  . APPENDECTOMY  1990  . WRIST SURGERY  2018    Current Medications: Current Meds  Medication Sig  . ezetimibe (ZETIA) 10 MG tablet Take 1 tablet (10 mg total) by mouth daily.  Marland Kitchen levonorgestrel (MIRENA) 20 MCG/DAY IUD 1 each by Intrauterine route.  . Magnesium Gluconate (MAGNESIUM 27 PO) Take by mouth.  . Probiotic Product (PROBIOTIC-10 PO) Take by mouth daily.  . rosuvastatin (CRESTOR) 20 MG tablet Take 1 tablet (20 mg total) by mouth daily. Please keep upcoming appt in May 2022 with Dr. Meda Coffee before anymore refills. Thank you  . SYNTHROID 150 MCG tablet Take 150 mcg by mouth daily.  Marland Kitchen  zolpidem (AMBIEN) 5 MG tablet TAKE 1 TABLET BY MOUTH ONCE DAILY AT BEDTIME AS NEEDED FOR SLEEP     Allergies:   Patient has no known allergies.   Social History   Socioeconomic History  . Marital status: Divorced    Spouse name: Not on file  . Number of children: Not on file  . Years of education: Not on file  . Highest education level: Not on file  Occupational History  . Occupation: Sport and exercise psychologist: [employer]    Comment: [employer]  Tobacco Use  . Smoking status: Never Smoker  . Smokeless tobacco: Never Used  Vaping Use  . Vaping Use: Never used  Substance and Sexual Activity  . Alcohol use: Yes    Comment: 1-2 a week  . Drug use: Never  . Sexual activity: Not on file  Other Topics Concern  . Not on file  Social History Narrative  . Not on file   Social Determinants of Health   Financial Resource Strain: Not on file  Food Insecurity: Not on file  Transportation Needs: Not on file  Physical Activity: Not on file  Stress: Not on file  Social Connections: Not on file     Family History: The patient's family history includes ALS in her father; Alcohol abuse in her maternal grandfather, mother, and sister; Arthritis in her paternal grandmother; Breast cancer in her paternal aunt; Drug abuse in her sister; Healthy in her  sister; Heart attack in her maternal grandfather and paternal grandfather; Heart disease in her maternal grandfather, maternal grandmother, and paternal grandfather; Hypercalcemia in her father and maternal grandmother; Hyperlipidemia in her maternal grandfather and paternal grandfather; Hypertension in her paternal grandfather.  ROS:   Please see the history of present illness.    Review of Systems  Constitutional: Negative for chills and fever.  HENT: Negative for sore throat.   Eyes: Negative for blurred vision.  Respiratory: Negative for shortness of breath.   Cardiovascular: Positive for palpitations. Negative for chest pain,  orthopnea, claudication, leg swelling and PND.  Gastrointestinal: Negative for nausea and vomiting.  Genitourinary: Negative for dysuria.  Musculoskeletal: Negative for falls.  Neurological: Negative for dizziness and loss of consciousness.  Endo/Heme/Allergies: Negative for polydipsia.  Psychiatric/Behavioral: Negative for substance abuse.    EKGs/Labs/Other Studies Reviewed:    The following studies were reviewed today: Calcium score 07/2019 FINDINGS: Non-cardiac: See separate report from [facility].  Ascending aorta: Normal  Pericardium: Normal  Coronary arteries: Normal origin  IMPRESSION: Coronary calcium score of 989. This was 63 percentile for age and sex matched control.  TTE 04/24/20: 1. Left ventricular ejection fraction, by estimation, is 60 to 65%. The  left ventricle has normal function. The left ventricle has no regional  wall motion abnormalities. Left ventricular diastolic parameters were  normal.  2. Right ventricular systolic function is normal. The right ventricular  size is normal.  3. The mitral valve is normal in structure. Trivial mitral valve  regurgitation. No evidence of mitral stenosis.  4. The aortic valve is normal in structure. Aortic valve regurgitation is  not visualized. No aortic stenosis is present.  5. The inferior vena cava is normal in size with greater than 50%  respiratory variability, suggesting right atrial pressure of 3 mmHg.   EKG:  EKG is  ordered today.  The ekg ordered today demonstrates NSR with HR 66  Recent Labs: 04/12/2021: ALT 24; BUN 13; Creatinine, Ser 0.76; Hemoglobin 13.7; Platelets 266; Potassium 4.3; Sodium 139  Recent Lipid Panel    Component Value Date/Time   CHOL 169 04/12/2021 0757   TRIG 120 04/12/2021 0757   HDL 63 04/12/2021 0757   CHOLHDL 2.7 04/12/2021 0757   CHOLHDL 4 08/14/2019 0809   VLDL 31.4 08/14/2019 0809   LDLCALC 85 04/12/2021 0757      Physical Exam:    VS:  BP  128/80   Pulse 66   Ht 5\' 2"  (1.575 m)   Wt 154 lb 12.8 oz (70.2 kg)   SpO2 98%   BMI 28.31 kg/m     Wt Readings from Last 3 Encounters:  04/16/21 154 lb 12.8 oz (70.2 kg)  04/03/20 151 lb 3.2 oz (68.6 kg)  10/15/19 149 lb 6.4 oz (67.8 kg)     GEN:  Well nourished, well developed in no acute distress HEENT: Normal NECK: No JVD; No carotid bruits CARDIAC: RRR, no murmurs, rubs, gallops RESPIRATORY:  Clear to auscultation without rales, wheezing or rhonchi  ABDOMEN: Soft, non-tender, non-distended MUSCULOSKELETAL:  No edema; No deformity  SKIN: Warm and dry NEUROLOGIC:  Alert and oriented x 3 PSYCHIATRIC:  Normal affect   ASSESSMENT:    1. Pure hypercholesterolemia   2. Medication management   3. Family history of heart disease   4. Coronary artery disease involving native coronary artery of native heart without angina pectoris    PLAN:    In order of problems listed above:  #HLD: LDL 140 in  07/2019 now improved to 85 on 04/12/21. Goal <70. Given cramps with crestor 20mg  daily, will not increase for now but rather will add zetia 10mg  daily -Start zetia 10mg  daily -Continue crestor 20mg  daily -Repeat lipids in 3 months -Goal LDL<70  #CAD with coronary calcium score 989: #Family history of CAD: Elevated calcium score 989 which puts her at 93% for matched controls. -Continue crestor 20mg  daily and add zetia 10mg  daily as above -Continue diet and exercise -Consider adding ASA 81mg  daily at next visit     Medication Adjustments/Labs and Tests Ordered: Current medicines are reviewed at length with the patient today.  Concerns regarding medicines are outlined above.  Orders Placed This Encounter  Procedures  . Lipid Profile  . EKG 12-Lead   Meds ordered this encounter  Medications  . ezetimibe (ZETIA) 10 MG tablet    Sig: Take 1 tablet (10 mg total) by mouth daily.    Dispense:  90 tablet    Refill:  1    Patient Instructions  Medication Instructions:   START  TAKING ZETIA 10 MG BY MOUTH DAILY  *If you need a refill on your cardiac medications before your next appointment, please call your pharmacy*   Lab Work:  IN 3 MONTHS--WE WILL CHECK LIPIDS--PLEASE COME FASTING TO THIS LAB APPOINTMENT  If you have labs (blood work) drawn today and your tests are completely normal, you will receive your results only by: Marland Kitchen MyChart Message (if you have MyChart) OR . A paper copy in the mail If you have any lab test that is abnormal or we need to change your treatment, we will call you to review the results.   Follow-Up: At [facility], you and your health needs are our priority.  As part of our continuing mission to provide you with exceptional heart care, we have created designated Provider Care Teams.  These Care Teams include your primary Cardiologist (physician) and Advanced Practice Providers (APPs -  Physician Assistants and Nurse Practitioners) who all work together to provide you with the care you need, when you need it.  We recommend signing up for the patient portal called "MyChart".  Sign up information is provided on this After Visit Summary.  MyChart is used to connect with patients for Virtual Visits (Telemedicine).  Patients are able to view lab/test results, encounter notes, upcoming appointments, etc.  Non-urgent messages can be sent to your provider as well.   To learn more about what you can do with MyChart, go to [email or web address].    Your next appointment:   1 year(s)  The format for your next appointment:   In Person  Provider:   Gwyndolyn Kaufman, MD       Signed, Freada Bergeron, MD  04/16/2021 10:27 AM    [place]

## 2021-04-15 ENCOUNTER — Ambulatory Visit: Payer: BC Managed Care – PPO | Admitting: Cardiology

## 2021-04-16 ENCOUNTER — Other Ambulatory Visit: Payer: Self-pay

## 2021-04-16 ENCOUNTER — Encounter: Payer: Self-pay | Admitting: Cardiology

## 2021-04-16 ENCOUNTER — Ambulatory Visit (INDEPENDENT_AMBULATORY_CARE_PROVIDER_SITE_OTHER): Payer: BC Managed Care – PPO | Admitting: Cardiology

## 2021-04-16 VITALS — BP 128/80 | HR 66 | Ht 62.0 in | Wt 154.8 lb

## 2021-04-16 DIAGNOSIS — E7800 Pure hypercholesterolemia, unspecified: Secondary | ICD-10-CM

## 2021-04-16 DIAGNOSIS — I2510 Atherosclerotic heart disease of native coronary artery without angina pectoris: Secondary | ICD-10-CM

## 2021-04-16 DIAGNOSIS — Z8249 Family history of ischemic heart disease and other diseases of the circulatory system: Secondary | ICD-10-CM | POA: Diagnosis not present

## 2021-04-16 DIAGNOSIS — Z79899 Other long term (current) drug therapy: Secondary | ICD-10-CM | POA: Diagnosis not present

## 2021-04-16 MED ORDER — EZETIMIBE 10 MG PO TABS: 10.0000 mg | ORAL_TABLET | Freq: Every day | ORAL | 1 refills | Status: DC

## 2021-04-16 NOTE — Patient Instructions (Signed)
Medication Instructions:   START TAKING ZETIA 10 MG BY MOUTH DAILY  *If you need a refill on your cardiac medications before your next appointment, please call your pharmacy*   Lab Work:  IN 3 MONTHS--WE WILL CHECK LIPIDS--PLEASE COME FASTING TO THIS LAB APPOINTMENT  If you have labs (blood work) drawn today and your tests are completely normal, you will receive your results only by: Marland Kitchen MyChart Message (if you have MyChart) OR . A paper copy in the mail If you have any lab test that is abnormal or we need to change your treatment, we will call you to review the results.   Follow-Up: At [facility], you and your health needs are our priority.  As part of our continuing mission to provide you with exceptional heart care, we have created designated Provider Care Teams.  These Care Teams include your primary Cardiologist (physician) and Advanced Practice Providers (APPs -  Physician Assistants and Nurse Practitioners) who all work together to provide you with the care you need, when you need it.  We recommend signing up for the patient portal called "MyChart".  Sign up information is provided on this After Visit Summary.  MyChart is used to connect with patients for Virtual Visits (Telemedicine).  Patients are able to view lab/test results, encounter notes, upcoming appointments, etc.  Non-urgent messages can be sent to your provider as well.   To learn more about what you can do with MyChart, go to [email or web address].    Your next appointment:   1 year(s)  The format for your next appointment:   In Person  Provider:   Gwyndolyn Kaufman, MD

## 2021-05-06 DIAGNOSIS — M65312 Trigger thumb, left thumb: Secondary | ICD-10-CM | POA: Diagnosis not present

## 2021-05-17 ENCOUNTER — Other Ambulatory Visit: Payer: Self-pay | Admitting: *Deleted

## 2021-05-17 MED ORDER — ROSUVASTATIN CALCIUM 20 MG PO TABS
20.0000 mg | ORAL_TABLET | Freq: Every day | ORAL | 2 refills | Status: DC
Start: 2021-05-17 — End: 2022-05-09

## 2021-07-15 ENCOUNTER — Encounter (INDEPENDENT_AMBULATORY_CARE_PROVIDER_SITE_OTHER): Payer: Self-pay

## 2021-07-19 ENCOUNTER — Other Ambulatory Visit: Payer: Self-pay

## 2021-07-19 ENCOUNTER — Other Ambulatory Visit: Payer: BC Managed Care – PPO | Admitting: *Deleted

## 2021-07-19 DIAGNOSIS — E7800 Pure hypercholesterolemia, unspecified: Secondary | ICD-10-CM

## 2021-07-19 DIAGNOSIS — Z79899 Other long term (current) drug therapy: Secondary | ICD-10-CM

## 2021-07-19 DIAGNOSIS — Z8249 Family history of ischemic heart disease and other diseases of the circulatory system: Secondary | ICD-10-CM

## 2021-07-19 LAB — LIPID PANEL
Chol/HDL Ratio: 2.4 ratio (ref 0.0–4.4)
Cholesterol, Total: 157 mg/dL (ref 100–199)
HDL: 66 mg/dL (ref >39)
LDL Chol Calc (NIH): 76 mg/dL (ref 0–99)
Triglycerides: 80 mg/dL (ref 0–149)
VLDL Cholesterol Cal: 15 mg/dL (ref 5–40)

## 2021-07-20 ENCOUNTER — Telehealth: Payer: Self-pay | Admitting: Pharmacist

## 2021-07-20 DIAGNOSIS — E7800 Pure hypercholesterolemia, unspecified: Secondary | ICD-10-CM

## 2021-07-30 ENCOUNTER — Ambulatory Visit: Payer: BC Managed Care – PPO

## 2021-08-06 DIAGNOSIS — D352 Benign neoplasm of pituitary gland: Secondary | ICD-10-CM | POA: Diagnosis not present

## 2021-08-09 ENCOUNTER — Other Ambulatory Visit: Payer: Self-pay | Admitting: Endocrinology

## 2021-08-09 DIAGNOSIS — D352 Benign neoplasm of pituitary gland: Secondary | ICD-10-CM

## 2021-08-13 ENCOUNTER — Ambulatory Visit (INDEPENDENT_AMBULATORY_CARE_PROVIDER_SITE_OTHER): Payer: BC Managed Care – PPO | Admitting: Pharmacist

## 2021-08-13 ENCOUNTER — Other Ambulatory Visit: Payer: Self-pay

## 2021-08-13 DIAGNOSIS — Z01419 Encounter for gynecological examination (general) (routine) without abnormal findings: Secondary | ICD-10-CM | POA: Diagnosis not present

## 2021-08-13 DIAGNOSIS — Z6828 Body mass index (BMI) 28.0-28.9, adult: Secondary | ICD-10-CM | POA: Diagnosis not present

## 2021-08-13 DIAGNOSIS — Z8249 Family history of ischemic heart disease and other diseases of the circulatory system: Secondary | ICD-10-CM | POA: Diagnosis not present

## 2021-08-13 DIAGNOSIS — Z9189 Other specified personal risk factors, not elsewhere classified: Secondary | ICD-10-CM | POA: Insufficient documentation

## 2021-08-13 DIAGNOSIS — E7800 Pure hypercholesterolemia, unspecified: Secondary | ICD-10-CM

## 2021-08-13 NOTE — Progress Notes (Signed)
Patient ID: Alexis Walker                 DOB: [birth year]                    MRN: [id number]     HPI: Alexis Walker is a 45 y.o. female patient referred to lipid clinic by [facility]. PMH is significant for  hyperlipidemia, strong family history of early CAD, and calcium score of 0.989 (93rd percentile). She was referred to lipid clinic to consider PCSK9i.  Patient presents today to lipid clinic. She is tolerating rosuvastatin and zetia ok. Reports some cramping in calves. States she does not do exercise even though she has access to [employer] and pool. Has arthritis in her knee that flares and swells every time she goes for walks/runs. She does very good with her diet until she gets home. Her boyfriend does not like to eat healthy and it makes it difficult for her. Has been using a smaller plate and eating a very small portion of rice/potatoes and more meat and vegetables.   Current Medications: rosuvastatin '20mg'$  daily, zetia '10mg'$  daily Intolerances: none Risk Factors: CAC 0.989 LDL goal: <70 but could consider <55  Diet: using smaller plate, limiting starches  Exercise: not very much- has arthritis in her knee that flares and swells every time she goes for walks/runs. Has to swim or bike but doesn't like to do those things. Want to get a personal trainer (if she could afford)  Family History:  Family History  Problem Relation Age of Onset   Breast cancer Paternal Aunt    Alcohol abuse Mother    ALS Father    Hypercalcemia Father    Alcohol abuse Sister    Drug abuse Sister    Hypercalcemia Maternal Grandmother    Heart disease Maternal Grandmother    Alcohol abuse Maternal Grandfather    Heart attack Maternal Grandfather    Heart disease Maternal Grandfather    Hyperlipidemia Maternal Grandfather    Arthritis Paternal Grandmother    Heart attack Paternal Grandfather    Heart disease Paternal Grandfather    Hypertension Paternal Grandfather    Hyperlipidemia Paternal Grandfather     Healthy Sister     Social History: Never smoked, 4 beers per week, no illicit drugs  0000000 TC 157, TG 80, HDL 66, LDL 76 non HDL 91  Past Medical History:  Diagnosis Date   Acquired hypothyroidism 02/27/2019   Blood in stool    Chronic back pain 07/31/2018   Elevated prolactin level 02/27/2019   Family history of heart disease 02/27/2019   Family hx of ALS (amyotrophic lateral sclerosis), father 02/27/2019   History of pituitary adenoma 02/27/2019   Primary insomnia 02/27/2019   Pure hypercholesterolemia 02/27/2019    Current Outpatient Medications on File Prior to Visit  Medication Sig Dispense Refill   ezetimibe (ZETIA) 10 MG tablet Take 1 tablet (10 mg total) by mouth daily. 90 tablet 1   levonorgestrel (MIRENA) 20 MCG/DAY IUD 1 each by Intrauterine route.     Magnesium Gluconate (MAGNESIUM 27 PO) Take by mouth.     Probiotic Product (PROBIOTIC-10 PO) Take by mouth daily.     rosuvastatin (CRESTOR) 20 MG tablet Take 1 tablet (20 mg total) by mouth daily. 90 tablet 2   SYNTHROID 150 MCG tablet Take 150 mcg by mouth daily.     zolpidem (AMBIEN) 5 MG tablet TAKE 1 TABLET BY MOUTH ONCE DAILY AT BEDTIME AS NEEDED FOR  SLEEP 30 tablet 1   No current facility-administered medications on file prior to visit.    No Known Allergies  Assessment/Plan:  1. Hyperlipidemia - LDL is above goal of <70 (consider more aggressive risk of <55) due to her strong family hx and postivie coronary calcium score at a young age. Discussed PCSK9i. Reviewed injection technique, cost and monitoring. Patient agreeable to start. I will submit prior authorization for Repatha. Continue rosvuastatin '20mg'$  daily and zetia '10mg'$  daily. Depending on how LDL responds to PCKS9i, may be able to stop zetia in the future. We discussed the importance of exercise. Gave her a few recommendations for ideas for exercise.  Recheck labs in 3 months  Thank you,  Ramond Dial, Pharm.D, BCPS, CPP [place]  [id number], [place], [place] [address]  Phone: [phone]; Fax: [phone]

## 2021-08-13 NOTE — Patient Instructions (Addendum)
[employer] [phone]  Continue taking zetia '10mg'$  daily and rosuvastatin '20mg'$  daily  I will call you once I hear back from your insurance company.  Call me at [phone] with any question

## 2021-08-26 DIAGNOSIS — Z1231 Encounter for screening mammogram for malignant neoplasm of breast: Secondary | ICD-10-CM | POA: Diagnosis not present

## 2021-08-27 ENCOUNTER — Other Ambulatory Visit: Payer: BC Managed Care – PPO

## 2021-08-30 DIAGNOSIS — M5416 Radiculopathy, lumbar region: Secondary | ICD-10-CM | POA: Diagnosis not present

## 2021-08-30 DIAGNOSIS — M5459 Other low back pain: Secondary | ICD-10-CM | POA: Diagnosis not present

## 2021-08-31 MED ORDER — REPATHA SURECLICK 140 MG/ML ~~LOC~~ SOAJ: 1.0000 "pen " | SUBCUTANEOUS | 11 refills | Status: DC

## 2021-09-04 DIAGNOSIS — M5451 Vertebrogenic low back pain: Secondary | ICD-10-CM | POA: Diagnosis not present

## 2021-09-08 DIAGNOSIS — M5451 Vertebrogenic low back pain: Secondary | ICD-10-CM | POA: Diagnosis not present

## 2021-09-13 ENCOUNTER — Other Ambulatory Visit: Payer: BC Managed Care – PPO

## 2021-09-14 DIAGNOSIS — M5451 Vertebrogenic low back pain: Secondary | ICD-10-CM | POA: Diagnosis not present

## 2021-09-14 DIAGNOSIS — M5416 Radiculopathy, lumbar region: Secondary | ICD-10-CM | POA: Diagnosis not present

## 2021-09-15 DIAGNOSIS — Z6828 Body mass index (BMI) 28.0-28.9, adult: Secondary | ICD-10-CM | POA: Diagnosis not present

## 2021-09-15 DIAGNOSIS — R030 Elevated blood-pressure reading, without diagnosis of hypertension: Secondary | ICD-10-CM | POA: Diagnosis not present

## 2021-09-15 DIAGNOSIS — M5126 Other intervertebral disc displacement, lumbar region: Secondary | ICD-10-CM | POA: Diagnosis not present

## 2021-09-15 DIAGNOSIS — M5416 Radiculopathy, lumbar region: Secondary | ICD-10-CM | POA: Diagnosis not present

## 2021-09-17 DIAGNOSIS — M6259 Muscle wasting and atrophy, not elsewhere classified, multiple sites: Secondary | ICD-10-CM | POA: Diagnosis not present

## 2021-09-17 DIAGNOSIS — M2569 Stiffness of other specified joint, not elsewhere classified: Secondary | ICD-10-CM | POA: Diagnosis not present

## 2021-09-17 DIAGNOSIS — M5416 Radiculopathy, lumbar region: Secondary | ICD-10-CM | POA: Diagnosis not present

## 2021-09-17 DIAGNOSIS — R269 Unspecified abnormalities of gait and mobility: Secondary | ICD-10-CM | POA: Diagnosis not present

## 2021-09-21 DIAGNOSIS — R269 Unspecified abnormalities of gait and mobility: Secondary | ICD-10-CM | POA: Diagnosis not present

## 2021-09-21 DIAGNOSIS — M2569 Stiffness of other specified joint, not elsewhere classified: Secondary | ICD-10-CM | POA: Diagnosis not present

## 2021-09-21 DIAGNOSIS — M5416 Radiculopathy, lumbar region: Secondary | ICD-10-CM | POA: Diagnosis not present

## 2021-09-21 DIAGNOSIS — M6259 Muscle wasting and atrophy, not elsewhere classified, multiple sites: Secondary | ICD-10-CM | POA: Diagnosis not present

## 2021-09-29 DIAGNOSIS — M5459 Other low back pain: Secondary | ICD-10-CM | POA: Diagnosis not present

## 2021-10-07 DIAGNOSIS — M5416 Radiculopathy, lumbar region: Secondary | ICD-10-CM | POA: Diagnosis not present

## 2021-10-08 ENCOUNTER — Other Ambulatory Visit: Payer: Self-pay

## 2021-10-08 ENCOUNTER — Ambulatory Visit
Admission: RE | Admit: 2021-10-08 | Discharge: 2021-10-08 | Disposition: A | Discharge status: Home / Self Care | Payer: BC Managed Care – PPO | Source: Ambulatory Visit | Attending: Endocrinology | Admitting: Endocrinology

## 2021-10-08 DIAGNOSIS — D352 Benign neoplasm of pituitary gland: Secondary | ICD-10-CM | POA: Diagnosis not present

## 2021-10-08 DIAGNOSIS — E237 Disorder of pituitary gland, unspecified: Secondary | ICD-10-CM | POA: Diagnosis not present

## 2021-10-08 MED ORDER — GADOBENATE DIMEGLUMINE 529 MG/ML IV SOLN
7.0000 mL | Freq: Once | INTRAVENOUS | Status: AC | PRN
  Administered 2021-10-08: 7 mL via INTRAVENOUS

## 2021-10-11 ENCOUNTER — Telehealth: Payer: Self-pay | Admitting: Cardiology

## 2021-10-11 DIAGNOSIS — E7800 Pure hypercholesterolemia, unspecified: Secondary | ICD-10-CM

## 2021-10-11 NOTE — Telephone Encounter (Signed)
Labs ordered. I have sent mychart for date she would like to come.

## 2021-10-11 NOTE — Telephone Encounter (Signed)
  Patient sent this message via patient schedule:  I need to have fasting labs done for a 3 month medicine change. I know Dr Johney Frame is on maternity leave just need a lab visit. Thanks  I do not see any orders for labs at this time. Please advise.

## 2021-10-12 DIAGNOSIS — M5416 Radiculopathy, lumbar region: Secondary | ICD-10-CM | POA: Diagnosis not present

## 2021-11-12 ENCOUNTER — Telehealth: Payer: Self-pay | Admitting: Cardiology

## 2021-11-12 DIAGNOSIS — Z79899 Other long term (current) drug therapy: Secondary | ICD-10-CM

## 2021-11-12 DIAGNOSIS — Z8249 Family history of ischemic heart disease and other diseases of the circulatory system: Secondary | ICD-10-CM

## 2021-11-12 DIAGNOSIS — E7800 Pure hypercholesterolemia, unspecified: Secondary | ICD-10-CM

## 2021-11-12 MED ORDER — EZETIMIBE 10 MG PO TABS: 10.0000 mg | ORAL_TABLET | Freq: Every day | ORAL | 1 refills | Status: DC

## 2021-11-12 NOTE — Telephone Encounter (Signed)
Pt's medication was sent to pt's pharmacy as requested. Confirmation received.  °

## 2021-11-12 NOTE — Telephone Encounter (Signed)
°*  STAT* If patient is at the pharmacy, call can be transferred to refill team.   1. Which medications need to be refilled? (please list name of each medication and dose if known) ezetimibe (ZETIA) 10 MG tablet  2. Which pharmacy/location (including street and city if local pharmacy) is medication to be sent to?[facility] - [place], [address]  3. Do they need a 30 day or 90 day supply? 90 day

## 2021-11-24 ENCOUNTER — Other Ambulatory Visit: Payer: BC Managed Care – PPO

## 2021-12-09 ENCOUNTER — Other Ambulatory Visit: Payer: 59

## 2021-12-09 ENCOUNTER — Other Ambulatory Visit: Payer: Self-pay

## 2021-12-09 DIAGNOSIS — E7800 Pure hypercholesterolemia, unspecified: Secondary | ICD-10-CM

## 2021-12-09 LAB — LIPID PANEL
Chol/HDL Ratio: 1.3 ratio (ref 0.0–4.4)
Cholesterol, Total: 89 mg/dL — ABNORMAL LOW (ref 100–199)
HDL: 71 mg/dL (ref >39)
LDL Chol Calc (NIH): 5 mg/dL (ref 0–99)
Triglycerides: 48 mg/dL (ref 0–149)
VLDL Cholesterol Cal: 13 mg/dL (ref 5–40)

## 2021-12-10 ENCOUNTER — Telehealth: Payer: Self-pay | Admitting: Cardiology

## 2021-12-10 NOTE — Telephone Encounter (Signed)
Patient is returning call to discuss lab results. 

## 2021-12-10 NOTE — Telephone Encounter (Signed)
The patient has been notified of the result and verbalized understanding.  All questions (if any) were answered. Nuala Alpha, LPN 5/42/3702 3:01 AM

## 2021-12-10 NOTE — Telephone Encounter (Signed)
-----   Message from Freada Bergeron, MD sent at 12/09/2021  7:46 PM EST ----- Cholesterol looks fantastic and very well controlled. No changes.

## 2021-12-24 ENCOUNTER — Encounter: Payer: Self-pay | Admitting: Cardiology

## 2021-12-24 ENCOUNTER — Other Ambulatory Visit: Payer: Self-pay | Admitting: Pharmacist

## 2021-12-24 MED ORDER — REPATHA SURECLICK 140 MG/ML ~~LOC~~ SOAJ: 1.0000 "pen " | SUBCUTANEOUS | 11 refills | Status: DC

## 2021-12-24 NOTE — Addendum Note (Signed)
Addended by: Rama Mcclintock E on: 12/24/2021 12:05 PM   Modules accepted: Orders

## 2022-04-01 ENCOUNTER — Ambulatory Visit: Payer: 59 | Admitting: Cardiology

## 2022-04-05 IMAGING — MR MR BREAST BILAT WO/W CM
8 of 12 series · 33 of 48 positions shown · IV contrast (6 ML GADAVIST)
Comparison: Prior mammograms and ultrasounds

CLINICAL DATA: 44-year-old female with high lifetime risk for
developing breast cancer (greater than 20%) for screening breast
MRI.

LABS:  None performed today
EXAM:
BILATERAL BREAST MRI WITH AND WITHOUT CONTRAST
TECHNIQUE: Multiplanar, multisequence MR images of both breasts were obtained
prior to and following the intravenous administration of 6 ml of
Gadavist

[Series 2: t2_tirm_tra ipat (a-p) · axial · 3.0mm · 0.70mm/px · 1 of 61 slices shown]
[im 1/61]
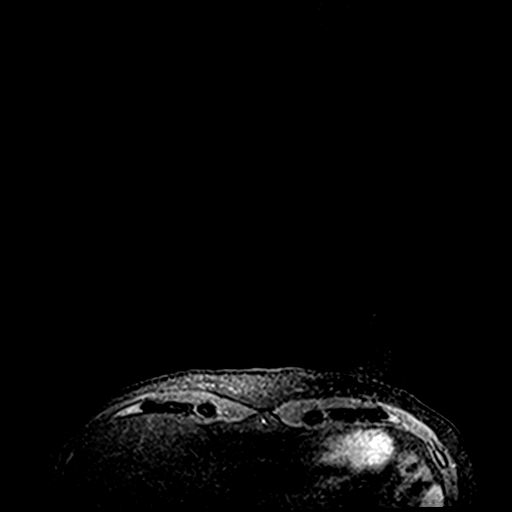

[Series 3: fl3d pre-cm no · axial · non-contrast · 1.2mm · 0.94mm/px · z∈[-65,+106]mm · 5 of 144 slices shown]
[im 1/144]
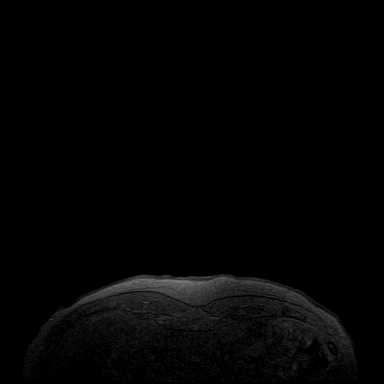
[im 36/144]
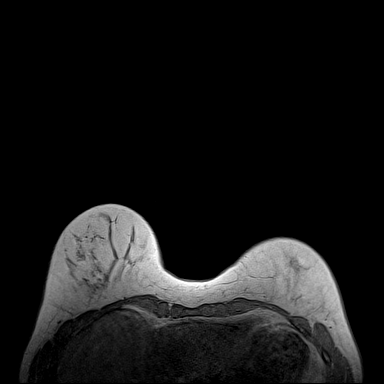
[im 72/144]
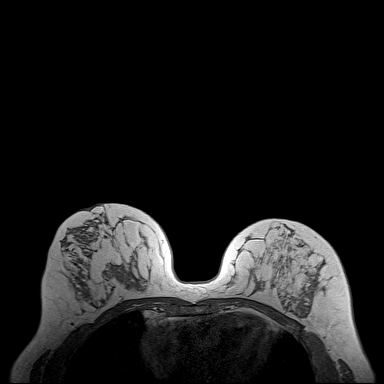
[im 108/144]
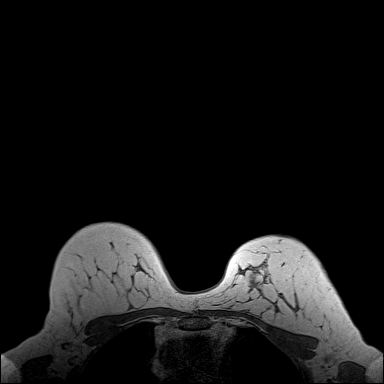
[im 144/144]
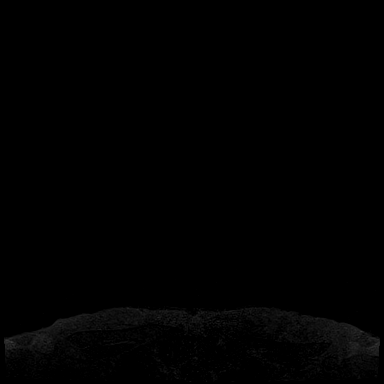

[Series 4: fl3d pre-cm · axial · non-contrast · 1.2mm · 0.94mm/px · z∈[-65,+106]mm · 5 of 144 slices shown]
[im 1/144]
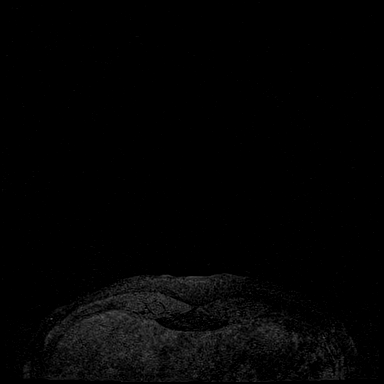
[im 36/144]
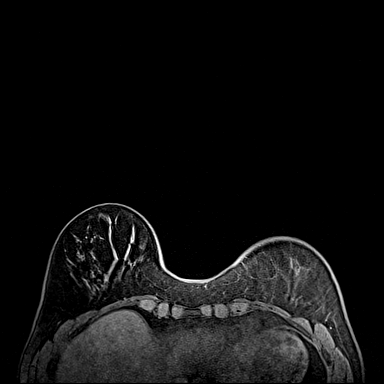
[im 72/144]
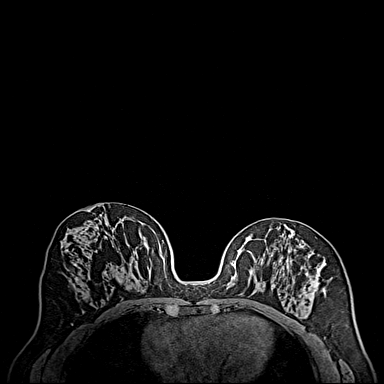
[im 108/144]
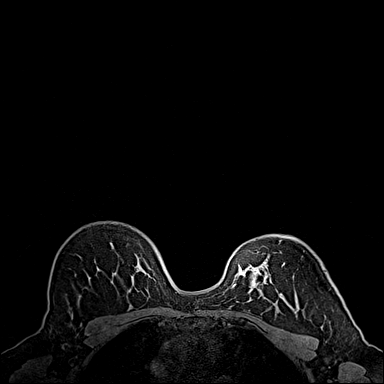
[im 144/144]
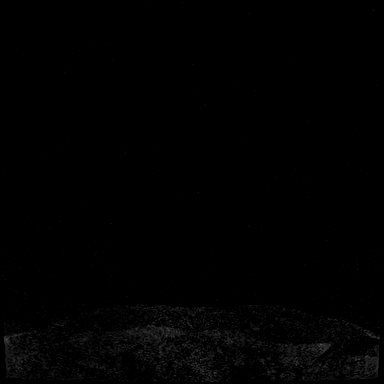

[Series 5: fl3d post immediate · axial · 1.2mm · 0.94mm/px · z∈[-65,+106]mm · 5 of 144 slices shown (1 of 3)]
[im 1/144]
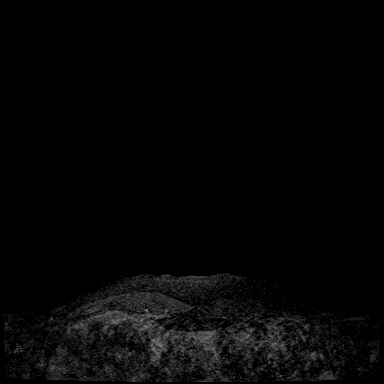
[im 36/144]
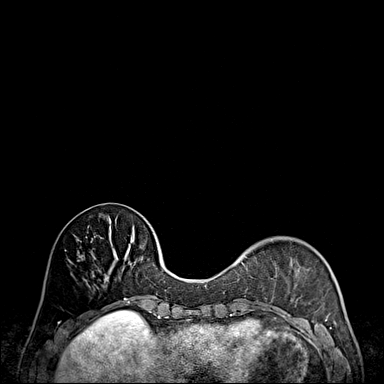
[im 72/144]
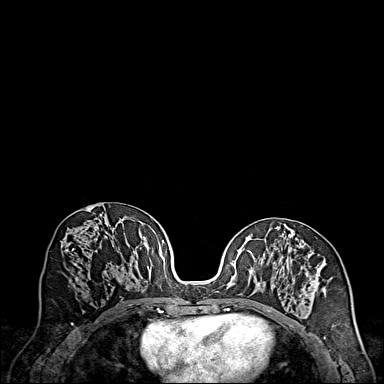
[im 108/144]
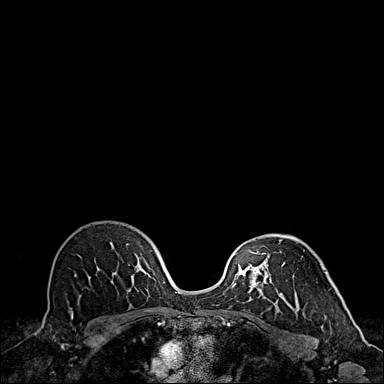
[im 144/144]
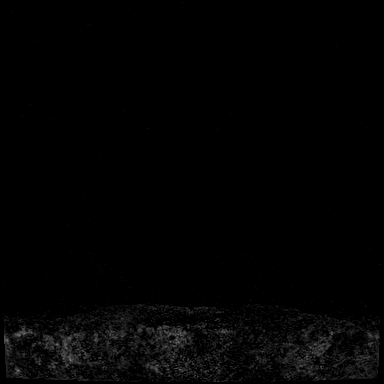

[Series 6: fl3d post immediate · axial · 1.2mm · 0.94mm/px · z∈[-65,+106]mm · 5 of 144 slices shown (2 of 3)]
[im 1/144]
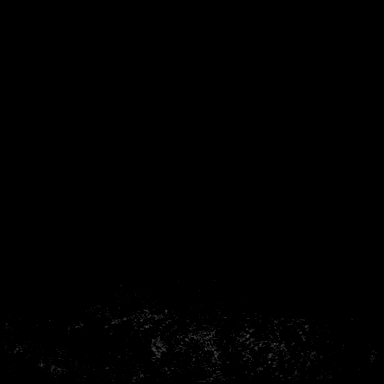
[im 36/144]
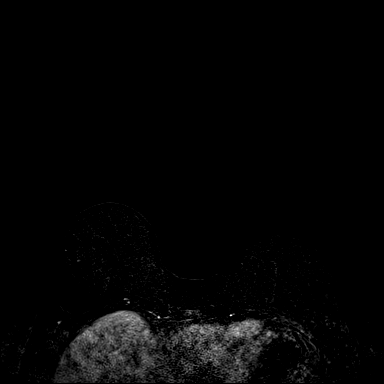
[im 72/144]
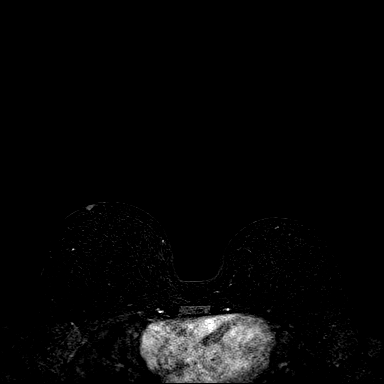
[im 108/144]
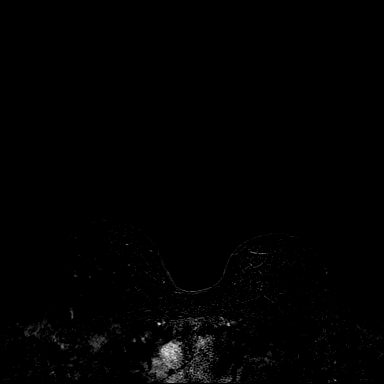
[im 144/144]
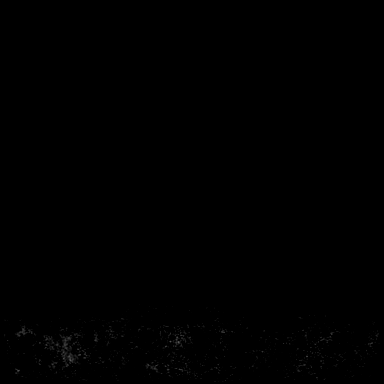

[Series 7: fl3d post immediate · axial · 172.8mm · 0.94mm/px · 1 of 1 slices shown (3 of 3)]
[im 1/1]
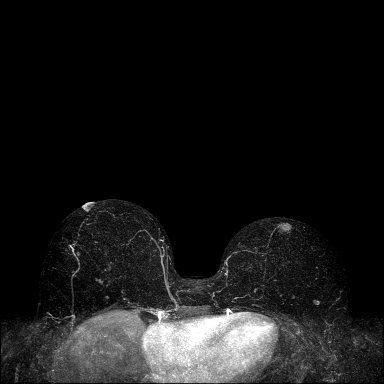

[Series 8: fl3d post 3min · axial · 1.2mm · 0.94mm/px · z∈[-65,+106]mm · 6 of 144 slices shown]
[im 1/144]
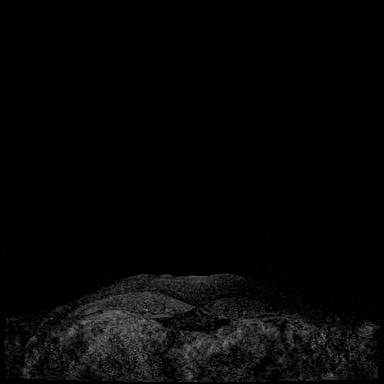
[im 29/144]
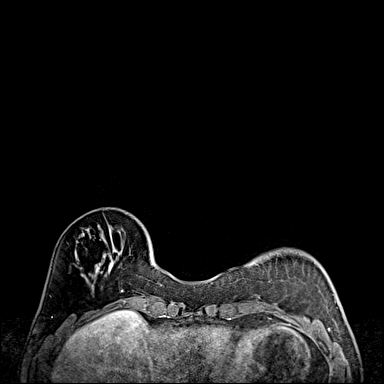
[im 58/144]
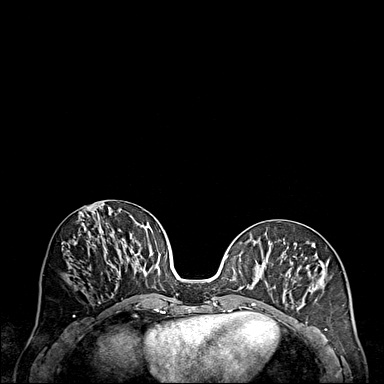
[im 86/144]
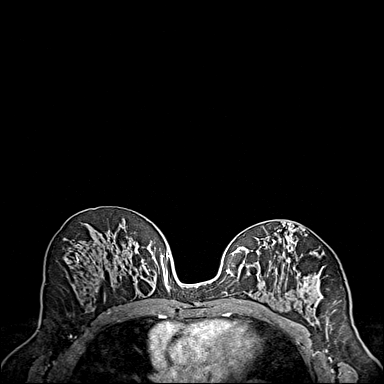
[im 115/144]
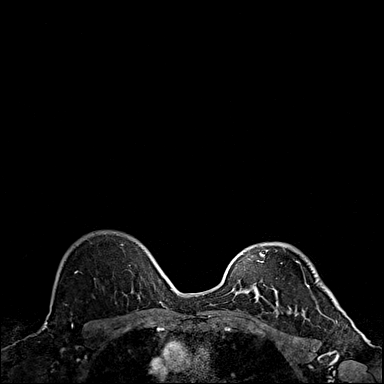
[im 144/144]
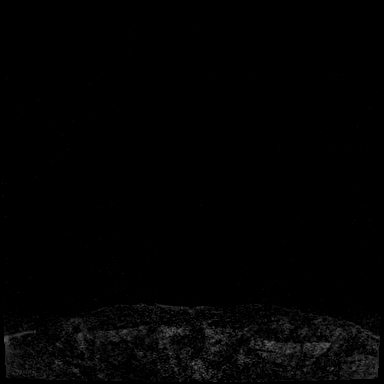

[Series 9: fl3d post 3min_sub · axial · 1.2mm · 0.94mm/px · z∈[-65,+71]mm · 5 of 144 slices shown]
[im 1/144]
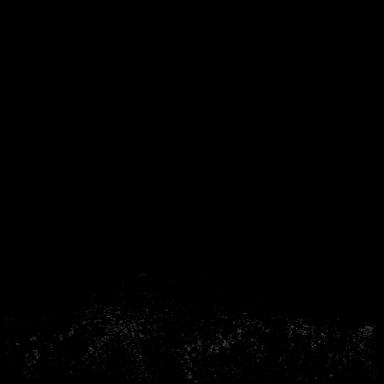
[im 29/144]
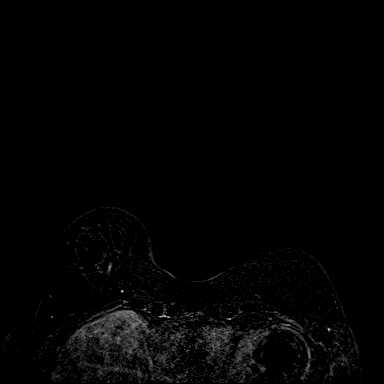
[im 58/144]
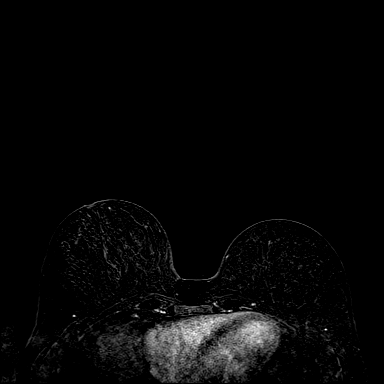
[im 86/144]
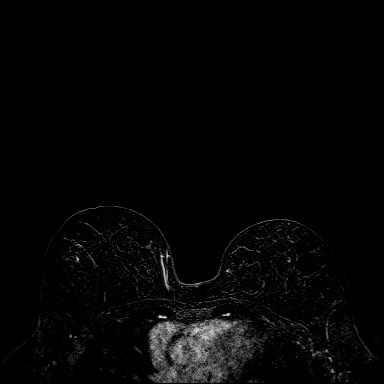
[im 115/144]
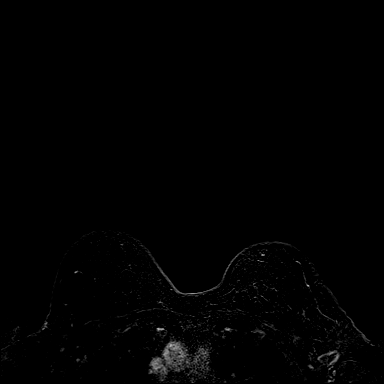

[33 of 48 positions shown; findings below may reference images not displayed]

Three-dimensional MR images were rendered by post-processing of the
original MR data on an independent workstation. The
three-dimensional MR images were interpreted, and findings are
reported in the following complete MRI report for this study. Three
dimensional images were evaluated at the independent interpreting
workstation using the DynaCAD thin client.
FINDINGS: Breast composition: c. Heterogeneous fibroglandular tissue.

Background parenchymal enhancement: Mild

Right breast: A 1 cm slightly irregular linear area of enhancement
is noted within the LOWER RIGHT breast, posterior depth (images
93-94: Series 6 and 9). No other abnormal areas of enhancement are
noted.

Left breast: No mass or abnormal enhancement.

Lymph nodes: No abnormal appearing lymph nodes.

Ancillary findings:  None.
IMPRESSION: 1. Indeterminate 1 cm linear enhancement within the LOWER RIGHT
breast. As this MR abnormality is unlikely to be identified
sonographically, MR guided biopsy is recommended.
2. No MRI evidence of LEFT breast malignancy.

RECOMMENDATION:
MR guided RIGHT breast biopsy.

BI-RADS CATEGORY  4: Suspicious.

## 2022-04-27 ENCOUNTER — Encounter: Payer: Self-pay | Admitting: Cardiology

## 2022-04-27 NOTE — Progress Notes (Signed)
Cardiology Office Note:    Date:  05/02/2022   ID:  Alexis Walker, DOB Oct 21, 1976, MRN [id number]  PCP:  Reynold Bowen, MD   [facility] Providers Cardiologist:  Ena Dawley, MD {   Referring MD: Reynold Bowen, MD     History of Present Illness:    Alexis Walker is a 46 y.o. female with a hx of hyperlipidemia, strong family history of early CAD, and calcium score of 989 who was previously followed by Dr. Meda Walker who now presents to clinic for follow-up.  Was last seen in clinic on 03/2021 where she was doing well. Rare palpitations. Remained active.  Today, the patient overall feels well. Has lost 7lbs by diet and trying to eat more healthy. She is increasing her exercise and walking. No chest pain, SOB, palpitations, lightheadedness, dizziness. Tolerating crestor without issues.    Past Medical History:  Diagnosis Date   Acquired hypothyroidism 02/27/2019   Blood in stool    Chronic back pain 07/31/2018   Elevated prolactin level 02/27/2019   Family history of heart disease 02/27/2019   Family hx of ALS (amyotrophic lateral sclerosis), father 02/27/2019   History of pituitary adenoma 02/27/2019   Primary insomnia 02/27/2019   Pure hypercholesterolemia 02/27/2019    Past Surgical History:  Procedure Laterality Date   APPENDECTOMY  1990   WRIST SURGERY  2018    Current Medications: Current Meds  Medication Sig   Evolocumab (REPATHA SURECLICK) 858 MG/ML SOAJ Inject 1 pen into the skin every 14 (fourteen) days.   ezetimibe (ZETIA) 10 MG tablet Take 1 tablet (10 mg total) by mouth daily.   Norethindrone-Ethinyl Estradiol-Fe Biphas (LO LOESTRIN FE) 1 MG-10 MCG / 10 MCG tablet    rosuvastatin (CRESTOR) 20 MG tablet Take 1 tablet (20 mg total) by mouth daily.   SYNTHROID 150 MCG tablet Take 150 mcg by mouth daily.   zolpidem (AMBIEN) 5 MG tablet TAKE 1 TABLET BY MOUTH ONCE DAILY AT BEDTIME AS NEEDED FOR SLEEP     Allergies:   Patient has no known allergies.   Social History    Socioeconomic History   Marital status: Divorced    Spouse name: Not on file   Number of children: Not on file   Years of education: Not on file   Highest education level: Not on file  Occupational History   Occupation: BILLING    Employer: [employer]    Comment: [employer]  Tobacco Use   Smoking status: Never   Smokeless tobacco: Never  Vaping Use   Vaping Use: Never used  Substance and Sexual Activity   Alcohol use: Yes    Comment: 1-2 a week   Drug use: Never   Sexual activity: Not on file  Other Topics Concern   Not on file  Social History Narrative   Not on file   Social Determinants of Health   Financial Resource Strain: Not on file  Food Insecurity: Not on file  Transportation Needs: Not on file  Physical Activity: Not on file  Stress: Not on file  Social Connections: Not on file     Family History: The patient's family history includes ALS in her father; Alcohol abuse in her maternal grandfather, mother, and sister; Arthritis in her paternal grandmother; Breast cancer in her paternal aunt; Drug abuse in her sister; Healthy in her sister; Heart attack in her maternal grandfather and paternal grandfather; Heart disease in her maternal grandfather, maternal grandmother, and paternal grandfather; Hypercalcemia in her father and  maternal grandmother; Hyperlipidemia in her maternal grandfather and paternal grandfather; Hypertension in her paternal grandfather.  ROS:   Please see the history of present illness.    Review of Systems  Constitutional:  Negative for chills and fever.  HENT:  Negative for sore throat.   Eyes:  Negative for blurred vision.  Respiratory:  Negative for shortness of breath.   Cardiovascular:  Positive for palpitations. Negative for chest pain, orthopnea, claudication, leg swelling and PND.  Gastrointestinal:  Negative for nausea and vomiting.  Genitourinary:  Negative for dysuria.  Musculoskeletal:  Negative for falls.   Neurological:  Negative for dizziness and loss of consciousness.  Endo/Heme/Allergies:  Negative for polydipsia.  Psychiatric/Behavioral:  Negative for substance abuse.    EKGs/Labs/Other Studies Reviewed:    The following studies were reviewed today: Calcium score 07/2019 FINDINGS: Non-cardiac: See separate report from [facility].   Ascending aorta: Normal   Pericardium: Normal   Coronary arteries: Normal origin   IMPRESSION: Coronary calcium score of 989. This was 72 percentile for age and sex matched control.  TTE 04/24/20:  1. Left ventricular ejection fraction, by estimation, is 60 to 65%. The  left ventricle has normal function. The left ventricle has no regional  wall motion abnormalities. Left ventricular diastolic parameters were  normal.   2. Right ventricular systolic function is normal. The right ventricular  size is normal.   3. The mitral valve is normal in structure. Trivial mitral valve  regurgitation. No evidence of mitral stenosis.   4. The aortic valve is normal in structure. Aortic valve regurgitation is  not visualized. No aortic stenosis is present.   5. The inferior vena cava is normal in size with greater than 50%  respiratory variability, suggesting right atrial pressure of 3 mmHg.   EKG:  No new ECG tracing today  Recent Labs: No results found for requested labs within last 8760 hours.  Recent Lipid Panel    Component Value Date/Time   CHOL 89 (L) 12/09/2021 0812   TRIG 48 12/09/2021 0812   HDL 71 12/09/2021 0812   CHOLHDL 1.3 12/09/2021 0812   CHOLHDL 4 08/14/2019 0809   VLDL 31.4 08/14/2019 0809   LDLCALC 5 12/09/2021 0812      Physical Exam:    VS:  BP 124/74   Pulse 71   Ht '5\' 2"'$  (1.575 m)   Wt 148 lb (67.1 kg)   SpO2 99%   BMI 27.07 kg/m     Wt Readings from Last 3 Encounters:  05/02/22 148 lb (67.1 kg)  04/16/21 154 lb 12.8 oz (70.2 kg)  04/03/20 151 lb 3.2 oz (68.6 kg)     GEN:  Well nourished, well  developed in no acute distress HEENT: Normal NECK: No JVD; No carotid bruits CARDIAC: RRR, no murmurs, rubs, gallops RESPIRATORY:  Clear to auscultation without rales, wheezing or rhonchi  ABDOMEN: Soft, non-tender, non-distended MUSCULOSKELETAL:  No edema; No deformity  SKIN: Warm and dry NEUROLOGIC:  Alert and oriented x 3 PSYCHIATRIC:  Normal affect   ASSESSMENT:    1. Pure hypercholesterolemia   2. Coronary artery disease involving native coronary artery of native heart without angina pectoris   3. Family history of heart disease     PLAN:    In order of problems listed above:  #HLD: LDL 11/2021 5 on repatha. -Continue zetia '10mg'$  daily -Continue crestor '20mg'$  daily -Continue repatha -Repeat lipids in 3 months -Goal LDL<70  #CAD with coronary calcium score 989: #Family history of  CAD: Elevated calcium score 1 which puts her at 93% for matched controls. -Continue repatha, zetia and crestor as above -Continue diet and exercise   Medication Adjustments/Labs and Tests Ordered: Current medicines are reviewed at length with the patient today.  Concerns regarding medicines are outlined above.  No orders of the defined types were placed in this encounter.  No orders of the defined types were placed in this encounter.   Patient Instructions  Medication Instructions:   Your physician recommends that you continue on your current medications as directed. Please refer to the Current Medication list given to you today.  *If you need a refill on your cardiac medications before your next appointment, please call your pharmacy*   Follow-Up: At [facility], you and your health needs are our priority.  As part of our continuing mission to provide you with exceptional heart care, we have created designated Provider Care Teams.  These Care Teams include your primary Cardiologist (physician) and Advanced Practice Providers (APPs -  Physician Assistants and Nurse Practitioners) who  all work together to provide you with the care you need, when you need it.  We recommend signing up for the patient portal called "MyChart".  Sign up information is provided on this After Visit Summary.  MyChart is used to connect with patients for Virtual Visits (Telemedicine).  Patients are able to view lab/test results, encounter notes, upcoming appointments, etc.  Non-urgent messages can be sent to your provider as well.   To learn more about what you can do with MyChart, go to [email or web address].    Your next appointment:   1 year(s)  The format for your next appointment:   In Person  Provider:   Dr. Johney Frame   Important Information About Sugar           Signed, Freada Bergeron, MD  05/02/2022 9:00 AM    [place]

## 2022-05-02 ENCOUNTER — Encounter: Payer: Self-pay | Admitting: Cardiology

## 2022-05-02 ENCOUNTER — Ambulatory Visit (INDEPENDENT_AMBULATORY_CARE_PROVIDER_SITE_OTHER): Payer: 59 | Admitting: Cardiology

## 2022-05-02 VITALS — BP 124/74 | HR 71 | Ht 62.0 in | Wt 148.0 lb

## 2022-05-02 DIAGNOSIS — E7800 Pure hypercholesterolemia, unspecified: Secondary | ICD-10-CM | POA: Diagnosis not present

## 2022-05-02 DIAGNOSIS — I2510 Atherosclerotic heart disease of native coronary artery without angina pectoris: Secondary | ICD-10-CM

## 2022-05-02 DIAGNOSIS — Z8249 Family history of ischemic heart disease and other diseases of the circulatory system: Secondary | ICD-10-CM

## 2022-05-02 NOTE — Patient Instructions (Signed)
Medication Instructions:   Your physician recommends that you continue on your current medications as directed. Please refer to the Current Medication list given to you today.  *If you need a refill on your cardiac medications before your next appointment, please call your pharmacy*   Follow-Up: At [facility], you and your health needs are our priority.  As part of our continuing mission to provide you with exceptional heart care, we have created designated Provider Care Teams.  These Care Teams include your primary Cardiologist (physician) and Advanced Practice Providers (APPs -  Physician Assistants and Nurse Practitioners) who all work together to provide you with the care you need, when you need it.  We recommend signing up for the patient portal called "MyChart".  Sign up information is provided on this After Visit Summary.  MyChart is used to connect with patients for Virtual Visits (Telemedicine).  Patients are able to view lab/test results, encounter notes, upcoming appointments, etc.  Non-urgent messages can be sent to your provider as well.   To learn more about what you can do with MyChart, go to [email or web address].    Your next appointment:   1 year(s)  The format for your next appointment:   In Person  Provider:   Dr. Pemberton   Important Information About Sugar       

## 2022-05-09 ENCOUNTER — Other Ambulatory Visit: Payer: Self-pay

## 2022-05-09 MED ORDER — ROSUVASTATIN CALCIUM 20 MG PO TABS: 20.0000 mg | ORAL_TABLET | Freq: Every day | ORAL | 3 refills | Status: DC

## 2022-07-18 ENCOUNTER — Encounter: Payer: Self-pay | Admitting: Cardiology

## 2022-07-18 DIAGNOSIS — Z79899 Other long term (current) drug therapy: Secondary | ICD-10-CM

## 2022-07-18 DIAGNOSIS — E7800 Pure hypercholesterolemia, unspecified: Secondary | ICD-10-CM

## 2022-07-18 DIAGNOSIS — Z8249 Family history of ischemic heart disease and other diseases of the circulatory system: Secondary | ICD-10-CM

## 2022-07-19 ENCOUNTER — Other Ambulatory Visit: Payer: Self-pay

## 2022-07-19 DIAGNOSIS — K5900 Constipation, unspecified: Secondary | ICD-10-CM | POA: Insufficient documentation

## 2022-07-19 DIAGNOSIS — K625 Hemorrhage of anus and rectum: Secondary | ICD-10-CM | POA: Insufficient documentation

## 2022-07-19 MED ORDER — EZETIMIBE 10 MG PO TABS: 10.0000 mg | ORAL_TABLET | Freq: Every day | ORAL | 1 refills | Status: DC

## 2022-08-23 ENCOUNTER — Encounter: Payer: Self-pay | Admitting: Cardiology

## 2022-12-20 ENCOUNTER — Encounter: Payer: Self-pay | Admitting: Cardiology

## 2022-12-20 DIAGNOSIS — G4733 Obstructive sleep apnea (adult) (pediatric): Secondary | ICD-10-CM

## 2022-12-22 ENCOUNTER — Telehealth: Payer: Self-pay | Admitting: *Deleted

## 2022-12-22 NOTE — Telephone Encounter (Signed)
-----  Message from [facility] sent at 12/21/2022 10:16 AM EST ----- Regarding: RE: refer to Dr. Radford Pax per Dr. Johney Frame for known OSA Good morning,  Patient scheduled for 01/20/23 at 3:00pm with Dr. Radford Pax.  ----- Message ----- From: Nuala Alpha, LPN Sent: 03/20/7022  11:22 AM EST To: Nuala Alpha, LPN; Imagene Gurney Subject: refer to Dr. Radford Pax per Dr. Johney Frame for kn#  Dr. Johney Frame wants this pt to be referred to Dr. Radford Pax to follow her sleep from here on out.  The pt has known OSA with CPAP device, but just needs to establish with Dr. Radford Pax.  Placed a referral to Dr. Radford Pax for this.  Pt aware you will call her to schedule this appt.  Can you please call her and arrange and shoot me the date thereafter?   Thanks for all you do, Karlene Einstein

## 2022-12-23 ENCOUNTER — Other Ambulatory Visit (HOSPITAL_COMMUNITY): Payer: Self-pay

## 2023-01-11 ENCOUNTER — Other Ambulatory Visit: Payer: Self-pay | Admitting: Pharmacist

## 2023-01-11 ENCOUNTER — Other Ambulatory Visit (HOSPITAL_COMMUNITY): Payer: Self-pay

## 2023-01-11 ENCOUNTER — Telehealth: Payer: Self-pay

## 2023-01-11 MED ORDER — REPATHA SURECLICK 140 MG/ML ~~LOC~~ SOAJ: 1.0000 | SUBCUTANEOUS | 11 refills | Status: DC

## 2023-01-11 NOTE — Telephone Encounter (Signed)
Pharmacy Patient Advocate Encounter   Received notification from [facility] that prior authorization for Repatha 154m/ml is required/requested.     PA submitted on 01/11/23 to (ins) [employer] via CoverMyMeds Key [id number] is pending

## 2023-01-11 NOTE — Telephone Encounter (Signed)
Pharmacy Patient Advocate Encounter  Prior Authorization for Repatha 11m/ml has been approved.    key# [id number] dates: 2.14.24 through 2.14.25

## 2023-01-18 ENCOUNTER — Other Ambulatory Visit: Payer: Self-pay | Admitting: *Deleted

## 2023-01-18 DIAGNOSIS — Z79899 Other long term (current) drug therapy: Secondary | ICD-10-CM

## 2023-01-18 DIAGNOSIS — Z8249 Family history of ischemic heart disease and other diseases of the circulatory system: Secondary | ICD-10-CM

## 2023-01-18 DIAGNOSIS — E7800 Pure hypercholesterolemia, unspecified: Secondary | ICD-10-CM

## 2023-01-18 MED ORDER — EZETIMIBE 10 MG PO TABS: 10.0000 mg | ORAL_TABLET | Freq: Every day | ORAL | 1 refills | Status: DC

## 2023-01-20 ENCOUNTER — Telehealth: Payer: Self-pay | Admitting: *Deleted

## 2023-01-20 ENCOUNTER — Encounter: Payer: Self-pay | Admitting: Cardiology

## 2023-01-20 ENCOUNTER — Ambulatory Visit: Payer: 59 | Attending: Cardiology | Admitting: Cardiology

## 2023-01-20 VITALS — BP 116/72 | HR 75 | Ht 62.0 in | Wt 140.0 lb

## 2023-01-20 DIAGNOSIS — G4733 Obstructive sleep apnea (adult) (pediatric): Secondary | ICD-10-CM | POA: Diagnosis not present

## 2023-01-20 NOTE — Telephone Encounter (Signed)
DR. Radford Pax has ordered an It[place] study today while pt was in the office. Pt agreeable to signed waiver and not open the box until called with the PIN#.

## 2023-01-20 NOTE — Patient Instructions (Signed)
Medication Instructions:  Your physician recommends that you continue on your current medications as directed. Please refer to the Current Medication list given to you today.  *If you need a refill on your cardiac medications before your next appointment, please call your pharmacy*   Lab Work: None.  If you have labs (blood work) drawn today and your tests are completely normal, you will receive your results only by: [place] (if you have MyChart) OR A paper copy in the mail If you have any lab test that is abnormal or we need to change your treatment, we will call you to review the results.   Testing/Procedures: Your physician has recommended that you have a sleep study. This test records several body functions during sleep, including: brain activity, eye movement, oxygen and carbon dioxide blood levels, heart rate and rhythm, breathing rate and rhythm, the flow of air through your mouth and nose, snoring, body muscle movements, and chest and belly movement.    Follow-Up: At [facility], you and your health needs are our priority.  As part of our continuing mission to provide you with exceptional heart care, we have created designated Provider Care Teams.  These Care Teams include your primary Cardiologist (physician) and Advanced Practice Providers (APPs -  Physician Assistants and Nurse Practitioners) who all work together to provide you with the care you need, when you need it.  We recommend signing up for the patient portal called "MyChart".  Sign up information is provided on this After Visit Summary.  MyChart is used to connect with patients for Virtual Visits (Telemedicine).  Patients are able to view lab/test results, encounter notes, upcoming appointments, etc.  Non-urgent messages can be sent to your provider as well.   To learn more about what you can do with MyChart, go to [email or web address].    Your next appointment will be dependent upon the results of  your home sleep study and it will be with:    Provider:   Dr. Fransico Him, MD

## 2023-01-20 NOTE — Progress Notes (Signed)
Sleep Medicine CONSULT Note    Date:  01/20/2023   ID:  Alexis Walker, DOB [birth year], MRN [id number]  PCP:  Reynold Bowen, MD  Cardiologist: Ena Dawley, MD   Chief Complaint  Patient presents with   New Patient (Initial Visit)    OSA    History of Present Illness:  Alexis Walker is a 47 y.o. female who is being seen today for the evaluation of OSA at the request of Gwyndolyn Kaufman, MD.  This is a 47yo female with a hx of HLD, insomnia and OSA on CPAP.  She has not been seeing a sleep MD and is now referred to Sleep Medicine to establish sleep care. She had her initial sleep study done at [facility] and I do not have her sleep study.  She was told she had mild OSA and was told that her options were CPAP or an oral device and she chose CPAP.    She stopped using the CPAP about 2 years ago.  She was taking it off in the middle of the night and did not sleep well with it and would feel more sleepy after using it.  Her boyfriend days that she snores early in the night but then the snoring stops.  He has not noticed her stop breathing in her sleep.  She has lost weight since her last her sleep study and has been exercising.    She has never felt rested when she gets up in 25 years.  She sleeps well for about 4 hours and then gets up to go to the bathroom and then goes back to sleep but not as restful sleep the rest of the night.  She does not remember dreaming in some time.  She does get tired during the day but cannot nap when she is home.  She practices good sleep hygiene.  She has no am HAs.  She denies any issues with brain fog during the day.   Past Medical History:  Diagnosis Date   Acquired hypothyroidism 02/27/2019   Blood in stool    Chronic back pain 07/31/2018   Elevated prolactin level 02/27/2019   Family history of heart disease 02/27/2019   Family hx of ALS (amyotrophic lateral sclerosis), father 02/27/2019   History of pituitary adenoma 02/27/2019   Primary insomnia  02/27/2019   Pure hypercholesterolemia 02/27/2019    Past Surgical History:  Procedure Laterality Date   APPENDECTOMY  1990   WRIST SURGERY  2018    Current Medications: Current Meds  Medication Sig   Evolocumab (REPATHA SURECLICK) XX123456 MG/ML SOAJ Inject 140 mg into the skin every 14 (fourteen) days.   ezetimibe (ZETIA) 10 MG tablet Take 1 tablet (10 mg total) by mouth daily.   Norethindrone-Ethinyl Estradiol-Fe Biphas (LO LOESTRIN FE) 1 MG-10 MCG / 10 MCG tablet    rosuvastatin (CRESTOR) 20 MG tablet Take 1 tablet (20 mg total) by mouth daily.   SYNTHROID 150 MCG tablet Take 150 mcg by mouth daily.   zolpidem (AMBIEN) 5 MG tablet TAKE 1 TABLET BY MOUTH ONCE DAILY AT BEDTIME AS NEEDED FOR SLEEP    Allergies:   Patient has no known allergies.   Social History   Socioeconomic History   Marital status: Divorced    Spouse name: Not on file   Number of children: Not on file   Years of education: Not on file   Highest education level: Not on file  Occupational History   Occupation: BILLING  Employer: [employer]    Comment: [employer]  Tobacco Use   Smoking status: Never   Smokeless tobacco: Never  Vaping Use   Vaping Use: Never used  Substance and Sexual Activity   Alcohol use: Yes    Comment: 1-2 a week   Drug use: Never   Sexual activity: Not on file  Other Topics Concern   Not on file  Social History Narrative   Not on file   Social Determinants of Health   Financial Resource Strain: Not on file  Food Insecurity: Not on file  Transportation Needs: Not on file  Physical Activity: Not on file  Stress: Not on file  Social Connections: Not on file     Family History:  The patient's family history includes ALS in her father; Alcohol abuse in her maternal grandfather, mother, and sister; Arthritis in her paternal grandmother; Breast cancer in her paternal aunt; Drug abuse in her sister; Healthy in her sister; Heart attack in her maternal grandfather and  paternal grandfather; Heart disease in her maternal grandfather, maternal grandmother, and paternal grandfather; Hypercalcemia in her father and maternal grandmother; Hyperlipidemia in her maternal grandfather and paternal grandfather; Hypertension in her paternal grandfather.   ROS:   Please see the history of present illness.    ROS All other systems reviewed and are negative.      No data to display             PHYSICAL EXAM:   VS:  BP 116/72   Pulse 75   Ht '5\' 2"'$  (1.575 m)   Wt 140 lb (63.5 kg)   SpO2 98%   BMI 25.61 kg/m    GEN: Well nourished, well developed, in no acute distress  HEENT: normal  Neck: no JVD, carotid bruits, or masses Cardiac: RRR; no murmurs, rubs, or gallops,no edema.  Intact distal pulses bilaterally.  Respiratory:  clear to auscultation bilaterally, normal work of breathing GI: soft, nontender, nondistended, + BS MS: no deformity or atrophy  Skin: warm and dry, no rash Neuro:  Alert and Oriented x 3, Strength and sensation are intact Psych: euthymic mood, full affect  Wt Readings from Last 3 Encounters:  01/20/23 140 lb (63.5 kg)  05/02/22 148 lb (67.1 kg)  04/16/21 154 lb 12.8 oz (70.2 kg)      Studies/Labs Reviewed:   PAP compliance downlaod  Recent Labs: No results found for requested labs within last 365 days.     ASSESSMENT:    1. OSA on CPAP      PLAN:  In order of problems listed above:  OSA  -she initially started on CPAP but it made her sleep issues worse and she stopped using it -she has lost weight and does not really had any excessive daytime sleepinss -I think we should get a home sleep study to reassess degree of OSA  Time Spent: 15 minutes total time of encounter, including 15 minutes spent in face-to-face patient care on the date of this encounter. This time includes coordination of care and counseling regarding above mentioned problem list. Remainder of non-face-to-face time involved reviewing chart  documents/testing relevant to the patient encounter and documentation in the medical record. I have independently reviewed documentation from referring provider  Medication Adjustments/Labs and Tests Ordered: Current medicines are reviewed at length with the patient today.  Concerns regarding medicines are outlined above.  Medication changes, Labs and Tests ordered today are listed in the Patient Instructions below.  There are no Patient  Instructions on file for this visit.   Signed, Fransico Him, MD  01/20/2023 3:35 PM    [facility] [address], [place], [place]  [address] Phone: [phone]; Fax: [phone]

## 2023-01-20 NOTE — Addendum Note (Signed)
Addended by: Joni Reining on: 01/20/2023 03:51 PM   Modules accepted: Orders

## 2023-01-23 NOTE — Telephone Encounter (Signed)
Prior Authorization for [facility] sent to [facility] via web portal. Tracking Number .  READY-Authorization is not required -Decision ID #: [id number]

## 2023-01-24 NOTE — Telephone Encounter (Signed)
Pt has been given PIN# [id number]. Pt states she cannot do her sleep study until March 3rd as she is currently away from home.   Called and made the patient aware that she may proceed with the [facility] Sleep Study. PIN # provided to the patient. Patient made aware that she will be contacted after the test has been read with the results and any recommendations. Patient verbalized understanding and thanked me for the call.

## 2023-01-29 ENCOUNTER — Encounter (INDEPENDENT_AMBULATORY_CARE_PROVIDER_SITE_OTHER): Payer: 59 | Admitting: Cardiology

## 2023-01-29 DIAGNOSIS — G4733 Obstructive sleep apnea (adult) (pediatric): Secondary | ICD-10-CM

## 2023-02-13 ENCOUNTER — Ambulatory Visit: Payer: 59 | Attending: Cardiology

## 2023-02-13 DIAGNOSIS — G4733 Obstructive sleep apnea (adult) (pediatric): Secondary | ICD-10-CM

## 2023-02-13 NOTE — Procedures (Signed)
     SLEEP STUDY REPORT Patient Information Study Date: 01/29/2023 Patient Name: Alexis Walker Patient ID: [id number] Birth Date: 1976-08-18 Age: 47 Gender: Female BMI: 26.0 (W=141 lb, H=5' 2'') Stopbang: 3 Referring Physician: Fransico Him, MD  TEST DESCRIPTION: Home sleep apnea testing was completed using the WatchPat, a Type 1 device, utilizing peripheral arterial tonometry (PAT), chest movement, actigraphy, pulse oximetry, pulse rate, body position and snore. AHI was calculated with apnea and hypopnea using valid sleep time as the denominator. RDI includes apneas, hypopneas, and RERAs. The data acquired and the scoring of sleep and all associated events were performed in accordance with the recommended standards and specifications as outlined in the AASM Manual for the Scoring of Sleep and Associated Events 2.2.0 (2015).   FINDINGS: 1.  No evidence of Obstructive Sleep Apnea with AHI 0.4/hr.  2.  No Central Sleep Apnea. 3.  Oxygen desaturations as low as 88%. 4.  Minimal snoring was present. O2 sats were < 88% for 0 minutes. 5.  Total sleep time was 7 hrs and 58 min. 6.  21.2% of total sleep time was spent in REM sleep.  7.  Normal sleep onset latency at 10 min.  8.  Shortened REM sleep onset latency at 41 min.  9.  Total awakenings were 7.   DIAGNOSIS:  Normal study with no significant sleep disordered breathing.  RECOMMENDATIONS:   1. Normal study with no significant sleep disordered breathing.  2.  Healthy sleep recommendations include:  adequate nightly sleep (normal 7-9 hrs/night), avoidance of caffeine after noon and alcohol near bedtime, and maintaining a sleep environment that is cool, dark and quiet.  3.  Weight loss for overweight patients is recommended.    4.  Snoring recommendations include:  weight loss where appropriate, side sleeping, and avoidance of alcohol before bed.  5.  Operation of motor vehicle or dangerous equipment must be avoided when feeling  drowsy, excessively sleepy, or mentally fatigued.    6.  An ENT consultation which may be useful for specific causes of and possible treatment of bothersome snoring.   7. Weight loss may be of benefit in reducing the severity of snoring.   Signature: Fransico Him, MD; [facility]; Diplomat, American Board of Sleep Medicine Electronically Signed: 02/13/2023

## 2023-02-15 ENCOUNTER — Telehealth: Payer: Self-pay | Admitting: *Deleted

## 2023-02-15 NOTE — Telephone Encounter (Signed)
-----   Message from Lauralee Evener, [place] sent at 02/13/2023  8:29 AM EDT -----  ----- Message ----- From: Sueanne Margarita, MD Sent: 02/13/2023   8:27 AM EDT To: [facility] Sleep Studies  Please let patient know that she no longer has oSA and does not need CPAP

## 2023-02-15 NOTE — Telephone Encounter (Signed)
Return call: The patient has been notified of the result and verbalized understanding.  All questions (if any) were answered. Marolyn Hammock, [place] 02/15/2023 6:37 PM    Pt is aware and agreeable to normal results.

## 2023-02-15 NOTE — Telephone Encounter (Signed)
The patient has been notified of the result. Left detailed message on voicemail and informed patient to call back..Caraline Deutschman Green, CMA   

## 2023-04-28 ENCOUNTER — Other Ambulatory Visit: Payer: Self-pay

## 2023-04-28 MED ORDER — ROSUVASTATIN CALCIUM 20 MG PO TABS: 20.0000 mg | ORAL_TABLET | Freq: Every day | ORAL | 0 refills | Status: DC

## 2023-05-01 ENCOUNTER — Ambulatory Visit: Payer: Self-pay | Admitting: Podiatry

## 2023-05-01 NOTE — Progress Notes (Signed)
Cardiology Office Note:    Date:  05/10/2023   ID:  Alexis Walker, DOB 1976-09-14, MRN [id number]  PCP:  Adrian Prince, MD   [facility] Providers Cardiologist:  Tobias Alexander, MD {   Referring MD: Adrian Prince, MD     History of Present Illness:    Alexis Walker is a 47 y.o. female with a hx of hyperlipidemia, strong family history of early CAD, and calcium score of 989 who was previously followed by Dr. Delton See who now presents to clinic for follow-up.  Was last seen in clinic on 04/2022 where she was doing very well. Repeat sleep study (had OSA previously) with no significant obstruction.   Today, the patient overall feels well. No chest pain, SOB, orthopnea, PND or LE edema. Joined weight watchers and lost 14lbs. Sleep study was negative for OSA. Remains very active with no exertional symptoms.    Past Medical History:  Diagnosis Date   Acquired hypothyroidism 02/27/2019   Blood in stool    Chronic back pain 07/31/2018   Elevated prolactin level 02/27/2019   Family history of heart disease 02/27/2019   Family hx of ALS (amyotrophic lateral sclerosis), father 02/27/2019   History of pituitary adenoma 02/27/2019   Primary insomnia 02/27/2019   Pure hypercholesterolemia 02/27/2019    Past Surgical History:  Procedure Laterality Date   APPENDECTOMY  1990   WRIST SURGERY  2018    Current Medications: Current Meds  Medication Sig   Evolocumab (REPATHA SURECLICK) 140 MG/ML SOAJ Inject 140 mg into the skin every 14 (fourteen) days.   ezetimibe (ZETIA) 10 MG tablet Take 1 tablet (10 mg total) by mouth daily.   Norethindrone-Ethinyl Estradiol-Fe Biphas (LO LOESTRIN FE) 1 MG-10 MCG / 10 MCG tablet    rosuvastatin (CRESTOR) 20 MG tablet Take 1 tablet (20 mg total) by mouth daily.   SYNTHROID 150 MCG tablet Take 137 mcg by mouth daily.   zolpidem (AMBIEN) 5 MG tablet TAKE 1 TABLET BY MOUTH ONCE DAILY AT BEDTIME AS NEEDED FOR SLEEP     Allergies:   Patient has no known allergies.    Social History   Socioeconomic History   Marital status: Divorced    Spouse name: Not on file   Number of children: Not on file   Years of education: Not on file   Highest education level: Not on file  Occupational History   Occupation: BILLING    Employer: [employer]    Comment: [employer]  Tobacco Use   Smoking status: Never   Smokeless tobacco: Never  Vaping Use   Vaping Use: Never used  Substance and Sexual Activity   Alcohol use: Yes    Comment: 1-2 a week   Drug use: Never   Sexual activity: Not on file  Other Topics Concern   Not on file  Social History Narrative   Not on file   Social Determinants of Health   Financial Resource Strain: Not on file  Food Insecurity: Not on file  Transportation Needs: Not on file  Physical Activity: Not on file  Stress: Not on file  Social Connections: Not on file     Family History: The patient's family history includes ALS in her father; Alcohol abuse in her maternal grandfather, mother, and sister; Arthritis in her paternal grandmother; Breast cancer in her paternal aunt; Drug abuse in her sister; Healthy in her sister; Heart attack in her maternal grandfather and paternal grandfather; Heart disease in her maternal grandfather, maternal grandmother, and  paternal grandfather; Hypercalcemia in her father and maternal grandmother; Hyperlipidemia in her maternal grandfather and paternal grandfather; Hypertension in her paternal grandfather.  ROS:   Please see the history of present illness.    As per HPI  EKGs/Labs/Other Studies Reviewed:    The following studies were reviewed today: Calcium score 07/2019 FINDINGS: Non-cardiac: See separate report from [facility].   Ascending aorta: Normal   Pericardium: Normal   Coronary arteries: Normal origin   IMPRESSION: Coronary calcium score of 989. This was 5 percentile for age and sex matched control.  TTE 04/24/20:  1. Left ventricular ejection  fraction, by estimation, is 60 to 65%. The  left ventricle has normal function. The left ventricle has no regional  wall motion abnormalities. Left ventricular diastolic parameters were  normal.   2. Right ventricular systolic function is normal. The right ventricular  size is normal.   3. The mitral valve is normal in structure. Trivial mitral valve  regurgitation. No evidence of mitral stenosis.   4. The aortic valve is normal in structure. Aortic valve regurgitation is  not visualized. No aortic stenosis is present.   5. The inferior vena cava is normal in size with greater than 50%  respiratory variability, suggesting right atrial pressure of 3 mmHg.   EKG:  NSR, HR 68-personally reviewed  Recent Labs: No results found for requested labs within last 365 days.  Recent Lipid Panel    Component Value Date/Time   CHOL 89 (L) 12/09/2021 0812   TRIG 48 12/09/2021 0812   HDL 71 12/09/2021 0812   CHOLHDL 1.3 12/09/2021 0812   CHOLHDL 4 08/14/2019 0809   VLDL 31.4 08/14/2019 0809   LDLCALC 5 12/09/2021 0812      Physical Exam:    VS:  BP 110/62   Pulse 68   Ht 5\' 2"  (1.575 m)   Wt 133 lb 6.4 oz (60.5 kg)   SpO2 99%   BMI 24.40 kg/m     Wt Readings from Last 3 Encounters:  05/10/23 133 lb 6.4 oz (60.5 kg)  01/20/23 140 lb (63.5 kg)  05/02/22 148 lb (67.1 kg)     GEN:  Well nourished, well developed in no acute distress HEENT: Normal NECK: No JVD; No carotid bruits CARDIAC: RRR, no murmurs RESPIRATORY:  Clear to auscultation without rales, wheezing or rhonchi  ABDOMEN: Soft, non-tender, non-distended MUSCULOSKELETAL:  No edema, warm.  SKIN: Warm and dry NEUROLOGIC:  Alert and oriented x 3 PSYCHIATRIC:  Normal affect   ASSESSMENT:    1. Coronary artery disease involving native coronary artery of native heart without angina pectoris   2. Pure hypercholesterolemia   3. Medication management     PLAN:    In order of problems listed above:  #HLD: LDL 11/2021.  Plan to repeat lipids today. -Check lipid panel -May be able to stop zetia depending on repeat lipids -Continue crestor 10mg  every other day -Continue repatha -Goal LDL<70  #CAD with coronary calcium score 989: #Family history of CAD: Elevated calcium score 1 which puts her at 93% for matched controls. -Continue repatha, zetia and crestor as above -Continue diet and exercise   Medication Adjustments/Labs and Tests Ordered: Current medicines are reviewed at length with the patient today.  Concerns regarding medicines are outlined above.  Orders Placed This Encounter  Procedures   Lipid Profile   EKG 12-Lead   No orders of the defined types were placed in this encounter.   Patient Instructions  Medication Instructions:  Your physician recommends that you continue on your current medications as directed. Please refer to the Current Medication list given to you today.  *If you need a refill on your cardiac medications before your next appointment, please call your pharmacy*   Lab Work:  TODAY--LIPIDS  If you have labs (blood work) drawn today and your tests are completely normal, you will receive your results only by: MyChart Message (if you have MyChart) OR A paper copy in the mail If you have any lab test that is abnormal or we need to change your treatment, we will call you to review the results.    Follow-Up: At [facility], you and your health needs are our priority.  As part of our continuing mission to provide you with exceptional heart care, we have created designated Provider Care Teams.  These Care Teams include your primary Cardiologist (physician) and Advanced Practice Providers (APPs -  Physician Assistants and Nurse Practitioners) who all work together to provide you with the care you need, when you need it.  We recommend signing up for the patient portal called "MyChart".  Sign up information is provided on this After Visit Summary.  MyChart is used  to connect with patients for Virtual Visits (Telemedicine).  Patients are able to view lab/test results, encounter notes, upcoming appointments, etc.  Non-urgent messages can be sent to your provider as well.   To learn more about what you can do with MyChart, go to [email or web address].    Your next appointment:   1 year(s)  Provider:   Jodelle Red, MD         Signed, Meriam Sprague, MD  05/10/2023 9:31 AM    [facility]

## 2023-05-10 ENCOUNTER — Encounter: Payer: Self-pay | Admitting: Cardiology

## 2023-05-10 ENCOUNTER — Ambulatory Visit: Payer: 59 | Attending: Cardiology | Admitting: Cardiology

## 2023-05-10 VITALS — BP 110/62 | HR 68 | Ht 62.0 in | Wt 133.4 lb

## 2023-05-10 DIAGNOSIS — I2510 Atherosclerotic heart disease of native coronary artery without angina pectoris: Secondary | ICD-10-CM

## 2023-05-10 DIAGNOSIS — Z79899 Other long term (current) drug therapy: Secondary | ICD-10-CM | POA: Diagnosis not present

## 2023-05-10 DIAGNOSIS — E7800 Pure hypercholesterolemia, unspecified: Secondary | ICD-10-CM | POA: Diagnosis not present

## 2023-05-10 NOTE — Patient Instructions (Signed)
Medication Instructions:   Your physician recommends that you continue on your current medications as directed. Please refer to the Current Medication list given to you today.  *If you need a refill on your cardiac medications before your next appointment, please call your pharmacy*   Lab Work:  TODAY--LIPIDS  If you have labs (blood work) drawn today and your tests are completely normal, you will receive your results only by: MyChart Message (if you have MyChart) OR A paper copy in the mail If you have any lab test that is abnormal or we need to change your treatment, we will call you to review the results.    Follow-Up: At [facility], you and your health needs are our priority.  As part of our continuing mission to provide you with exceptional heart care, we have created designated Provider Care Teams.  These Care Teams include your primary Cardiologist (physician) and Advanced Practice Providers (APPs -  Physician Assistants and Nurse Practitioners) who all work together to provide you with the care you need, when you need it.  We recommend signing up for the patient portal called "MyChart".  Sign up information is provided on this After Visit Summary.  MyChart is used to connect with patients for Virtual Visits (Telemedicine).  Patients are able to view lab/test results, encounter notes, upcoming appointments, etc.  Non-urgent messages can be sent to your provider as well.   To learn more about what you can do with MyChart, go to [email or web address].    Your next appointment:   1 year(s)  Provider:   Jodelle Red, MD

## 2023-05-11 ENCOUNTER — Telehealth: Payer: Self-pay | Admitting: *Deleted

## 2023-05-11 LAB — LIPID PANEL
Chol/HDL Ratio: 1.5 ratio (ref 0.0–4.4)
Cholesterol, Total: 91 mg/dL — ABNORMAL LOW (ref 100–199)
HDL: 60 mg/dL (ref >39)
LDL Chol Calc (NIH): 16 mg/dL (ref 0–99)
Triglycerides: 65 mg/dL (ref 0–149)
VLDL Cholesterol Cal: 15 mg/dL (ref 5–40)

## 2023-05-11 NOTE — Telephone Encounter (Signed)
-----   Message from Meriam Sprague, MD sent at 05/11/2023  9:41 AM EDT ----- Cholesterol looks awesome! She can stop the zetia!

## 2023-05-11 NOTE — Telephone Encounter (Signed)
Loa Socks, LPN 07/26/5620 30:86 AM EDT Back to Top    Left message for the pt to call back for results. Did leave her a detailed message that I will be sending her these results via her mychart account and she can call back with any additional questions/concerns about these results.   Will discontinue zetia from the pts med list.

## 2023-07-28 ENCOUNTER — Other Ambulatory Visit: Payer: Self-pay

## 2023-07-28 MED ORDER — ROSUVASTATIN CALCIUM 20 MG PO TABS: 20.0000 mg | ORAL_TABLET | Freq: Every day | ORAL | 2 refills | Status: AC

## 2023-10-23 ENCOUNTER — Ambulatory Visit
Admission: RE | Admit: 2023-10-23 | Discharge: 2023-10-23 | Disposition: A | Discharge status: Home / Self Care | Payer: 59 | Source: Ambulatory Visit | Attending: Obstetrics and Gynecology | Admitting: Obstetrics and Gynecology

## 2023-10-23 ENCOUNTER — Other Ambulatory Visit: Payer: Self-pay | Admitting: Obstetrics and Gynecology

## 2023-10-23 DIAGNOSIS — M940 Chondrocostal junction syndrome [Tietze]: Secondary | ICD-10-CM

## 2023-11-14 ENCOUNTER — Telehealth: Payer: Self-pay | Admitting: Cardiology

## 2023-11-14 DIAGNOSIS — E7800 Pure hypercholesterolemia, unspecified: Secondary | ICD-10-CM

## 2023-11-14 DIAGNOSIS — I2510 Atherosclerotic heart disease of native coronary artery without angina pectoris: Secondary | ICD-10-CM

## 2023-11-14 MED ORDER — REPATHA SURECLICK 140 MG/ML ~~LOC~~ SOAJ: 1.0000 | SUBCUTANEOUS | 5 refills | Status: DC

## 2023-11-14 NOTE — Telephone Encounter (Signed)
Repatha refill  

## 2023-11-14 NOTE — Telephone Encounter (Signed)
*  STAT* If patient is at the pharmacy, call can be transferred to refill team.   1. Which medications need to be refilled? (please list name of each medication and dose if known)  Evolocumab (REPATHA SURECLICK) 140 MG/ML SOAJ  2. Which pharmacy/location (including street and city if local pharmacy) is medication to be sent to? [id number] - [place], [place] - [address]  3. Do they need a 30 day or 90 day supply?   28 day supply if possible.  Patient states [employer] will only cover 3 syringes in a quantity of 28 days.

## 2023-12-22 ENCOUNTER — Other Ambulatory Visit (HOSPITAL_COMMUNITY): Payer: Self-pay

## 2023-12-26 ENCOUNTER — Other Ambulatory Visit (HOSPITAL_COMMUNITY): Payer: Self-pay

## 2023-12-26 ENCOUNTER — Telehealth: Payer: Self-pay

## 2023-12-26 NOTE — Telephone Encounter (Addendum)
Pharmacy Patient Advocate Encounter   Received notification from CoverMyMeds that prior authorization for REPATHA is required/requested.   Insurance verification completed.   The patient is insured through [facility] .   Per test claim: The current 28 day co-pay is, $0.  No PA needed at this time. This test claim was processed through [facility]- copay amounts may vary at other pharmacies due to pharmacy/plan contracts, or as the patient moves through the different stages of their insurance plan.

## 2024-02-19 ENCOUNTER — Other Ambulatory Visit: Payer: Self-pay | Admitting: Cardiology

## 2024-02-19 DIAGNOSIS — E7800 Pure hypercholesterolemia, unspecified: Secondary | ICD-10-CM

## 2024-02-19 DIAGNOSIS — I2510 Atherosclerotic heart disease of native coronary artery without angina pectoris: Secondary | ICD-10-CM

## 2024-04-29 ENCOUNTER — Ambulatory Visit (HOSPITAL_BASED_OUTPATIENT_CLINIC_OR_DEPARTMENT_OTHER): Payer: 59 | Admitting: Cardiology

## 2024-04-29 NOTE — Progress Notes (Signed)
 Cardiology Office Note   Date:  04/30/2024  ID:  Alexis Walker, DOB 21-Dec-1975, MRN [id number] PCP: Rosslyn Coons, MD  [facility] Providers Cardiologist:  Sheryle Donning, MD     PMH Hyperlipidemia Family history early CAD Coronary artery disease CT Calcium  score of 0.989 (93rd percentile) Macular degeneration  Initially seen by Dr. Nicholette Barley for strong family history of early CAD and hyperlipidemia.  Echocardiogram 04/24/2020 revealed normal heart function and no significant valvular abnormalities.  CT calcium  score revealed CAC of 0.989 (93rd percentile).  Last cardiology clinic visit was 05/10/2023 with Dr. Ardell Beauvais.  She had joined weight watchers and lost 14 lbs.  LDL was well-controlled on Repatha  and Crestor  20 mg every other day. Zetia  was discontinued.   Previously diagnosed with OSA and on CPAP with [facility] sleep study 02/14/2023 that revealed no evidence of OSA.  History of Present Illness History of Present Illness Alexis Walker is a very pleasant 48 year old female who presents for  follow-up of mildly elevated coronary calcium  score.  She reports she is feeling well.  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 She has hyperlipidemia, managed with Repatha  and Crestor .  She unfortunately continues to have leg pain Crestor  20 mg every other day.  No concerning side effects with Repatha .  Her blood pressure was previously elevated when she was heavier. There is a significant family history of heart disease on both maternal and paternal sides. Her maternal grandfather died from a massive heart attack at a young age, and her paternal grandfather also had a heart attack. She maintains a healthy diet, rarely eating out, and focuses on high protein intake, particularly in the mornings. She engages in physical activity by walking during lunchtime and wants to incorporate more exercise, such as kettlebell swings, into her routine.  She denies chest  pain, shortness of breath, palpitations, orthopnea, edema, presyncope, syncope.  ROS: See HPI  Studies Reviewed EKG Interpretation Date/Time:  Tuesday April 30 2024 08:06:06 EDT Ventricular Rate:  64 PR Interval:  136 QRS Duration:  74 QT Interval:  404 QTC Calculation: 416 R Axis:   83  Text Interpretation: Normal sinus rhythm Normal ECG No previous ECGs available Confirmed by Slater Duncan [phone]) on 04/30/2024 8:13:22 AM     No results found for: "LIPOA"  Risk Assessment/Calculations       STOP-Bang Score:         Physical Exam VS:  BP 120/72 (BP Location: Left Arm, Patient Position: Sitting, Cuff Size: Normal)   Pulse 64   Ht 5\' 2"  (1.575 m)   Wt 140 lb (63.5 kg)   SpO2 99%   BMI 25.61 kg/m    Wt Readings from Last 3 Encounters:  04/30/24 140 lb (63.5 kg)  05/10/23 133 lb 6.4 oz (60.5 kg)  01/20/23 140 lb (63.5 kg)    GEN: Well nourished, well developed in no acute distress NECK: No JVD; No carotid bruits CARDIAC: RRR, no murmurs, rubs, gallops RESPIRATORY:  Clear to auscultation without rales, wheezing or rhonchi  ABDOMEN: Soft, non-tender, non-distended EXTREMITIES:  No edema; No deformity   ASSESSMENT AND PLAN Assessment & Plan Hyperlipidemia  LDL goal < 70 Lipid panel 01/2950 with total cholesterol 91, HDL 60, LDL-C 16, and triglycerides 65.  She is having leg pain with Crestor  20 mg taken every other day. If LDL is controlled, we will plan to adjust Crestor  to 10 mg three times a week. No concerning side effect with Repatha .  We will check cholesterol, LFT and lipoprotein A levels today.  Continue rosuvastatin  and Repatha .  CAD Mildly elevated coronary artery calcium  score on CT of 0.989 in 2020.  We discussed appropriate timing of repeating the test with recommendations, approximately 7-10 years after initial screening. She is active with walking but admits she needs to intensify exercise regimen. She denies chest pain, dyspnea, or other symptoms concerning  for angina. EKG today reveals NSR, no ST abnormality. No indication for further ischemic evaluation at this time. She is not on asa at this time due to mildly elevated CAC. Focus on secondary prevention including heart healthy mostly plant based diet avoiding saturated fat, processed foods, simple carbohydrates, and sugar along with aiming for at least 150 minutes of moderate intensity exercise each week. Continue rosuvastatin , Repatha .   Screening for Diabetes Last A1C 5.2 in 2018. We will repeat A1C today to screen for diabetes due to strong family history or CAD and additional risk factor of hyperlipidemia.  Cardiac Risk Counseling/Family History Early CAD BP is well-controlled.  Lipids and A1c have been well-controlled but we will repeat testing today. She does not smoke. Continue to focus on secondary prevention including heart healthy mostly plant based diet avoiding saturated fat, processed foods, simple carbohydrates, and sugar along with aiming for at least 150 minutes of moderate intensity exercise each week.          Disposition: 1 year with Dr. Veryl Gottron or APP  Signed, Slater Duncan, NP-C

## 2024-04-30 ENCOUNTER — Ambulatory Visit (INDEPENDENT_AMBULATORY_CARE_PROVIDER_SITE_OTHER): Admitting: Nurse Practitioner

## 2024-04-30 ENCOUNTER — Encounter (HOSPITAL_BASED_OUTPATIENT_CLINIC_OR_DEPARTMENT_OTHER): Payer: Self-pay | Admitting: Nurse Practitioner

## 2024-04-30 VITALS — BP 120/72 | HR 64 | Ht 62.0 in | Wt 140.0 lb

## 2024-04-30 DIAGNOSIS — Z131 Encounter for screening for diabetes mellitus: Secondary | ICD-10-CM | POA: Diagnosis not present

## 2024-04-30 DIAGNOSIS — Z8249 Family history of ischemic heart disease and other diseases of the circulatory system: Secondary | ICD-10-CM

## 2024-04-30 DIAGNOSIS — I2510 Atherosclerotic heart disease of native coronary artery without angina pectoris: Secondary | ICD-10-CM

## 2024-04-30 DIAGNOSIS — E785 Hyperlipidemia, unspecified: Secondary | ICD-10-CM | POA: Diagnosis not present

## 2024-04-30 NOTE — Patient Instructions (Signed)
 Medication Instructions:   Your physician recommends that you continue on your current medications as directed. Please refer to the Current Medication list given to you today.   *If you need a refill on your cardiac medications before your next appointment, please call your pharmacy*  Lab Work:  TODAY!!!! LPA/NMR/A1C//LFT  If you have labs (blood work) drawn today and your tests are completely normal, you will receive your results only by: MyChart Message (if you have MyChart) OR A paper copy in the mail If you have any lab test that is abnormal or we need to change your treatment, we will call you to review the results.  Testing/Procedures:  None ordered.   Follow-Up: At [facility], you and your health needs are our priority.  As part of our continuing mission to provide you with exceptional heart care, our providers are all part of one team.  This team includes your primary Cardiologist (physician) and Advanced Practice Providers or APPs (Physician Assistants and Nurse Practitioners) who all work together to provide you with the care you need, when you need it.  Your next appointment:   1 year(s)  Provider:   Sheryle Donning, MD, Slater Duncan, NP, or Neomi Banks, NP    We recommend signing up for the patient portal called "MyChart".  Sign up information is provided on this After Visit Summary.  MyChart is used to connect with patients for Virtual Visits (Telemedicine).  Patients are able to view lab/test results, encounter notes, upcoming appointments, etc.  Non-urgent messages can be sent to your provider as well.   To learn more about what you can do with MyChart, go to [email or web address].   Other Instructions  Your physician wants you to follow-up in: 1  year.  You will receive a reminder letter in the mail two months in advance. If you don't receive a letter, please call our office to schedule the follow-up appointment.

## 2024-05-01 LAB — NMR, LIPOPROFILE
Cholesterol, Total: 138 mg/dL (ref 100–199)
HDL Particle Number: 52.6 umol/L (ref >=30.5)
HDL-C: 70 mg/dL (ref >39)
LDL Particle Number: 847 nmol/L (ref <1000)
LDL Size: 19.8 nm — ABNORMAL LOW (ref >20.5)
LDL-C (NIH Calc): 48 mg/dL (ref 0–99)
LP-IR Score: 50 — ABNORMAL HIGH (ref <=45)
Small LDL Particle Number: 537 nmol/L — ABNORMAL HIGH (ref <=527)
Triglycerides: 117 mg/dL (ref 0–149)

## 2024-05-01 LAB — HEPATIC FUNCTION PANEL
ALT: 14 IU/L (ref 0–32)
AST: 17 IU/L (ref 0–40)
Albumin: 4.6 g/dL (ref 3.9–4.9)
Alkaline Phosphatase: 66 IU/L (ref 44–121)
Bilirubin Total: 0.4 mg/dL (ref 0.0–1.2)
Bilirubin, Direct: 0.15 mg/dL (ref 0.00–0.40)
Total Protein: 7.2 g/dL (ref 6.0–8.5)

## 2024-05-01 LAB — HEMOGLOBIN A1C
Est. average glucose Bld gHb Est-mCnc: 105 mg/dL
Hgb A1c MFr Bld: 5.3 % (ref 4.8–5.6)

## 2024-05-01 LAB — LIPOPROTEIN A (LPA): Lipoprotein (a): 14.5 nmol/L (ref <75.0)

## 2024-05-02 ENCOUNTER — Ambulatory Visit (HOSPITAL_BASED_OUTPATIENT_CLINIC_OR_DEPARTMENT_OTHER): Payer: Self-pay | Admitting: Nurse Practitioner

## 2024-11-25 ENCOUNTER — Other Ambulatory Visit: Payer: Self-pay | Admitting: Cardiology

## 2024-11-25 DIAGNOSIS — E7800 Pure hypercholesterolemia, unspecified: Secondary | ICD-10-CM

## 2024-11-25 DIAGNOSIS — I2510 Atherosclerotic heart disease of native coronary artery without angina pectoris: Secondary | ICD-10-CM
